# Patient Record
Sex: Female | Born: 1970
Health system: Southern US, Community
[De-identification: ages and names within clinical notes are randomized; demographics above are authoritative.]

## PROBLEM LIST (undated history)

## (undated) DIAGNOSIS — I10 Essential (primary) hypertension: Secondary | ICD-10-CM

## (undated) DIAGNOSIS — T7840XA Allergy, unspecified, initial encounter: Secondary | ICD-10-CM

## (undated) DIAGNOSIS — E785 Hyperlipidemia, unspecified: Secondary | ICD-10-CM

## (undated) DIAGNOSIS — K219 Gastro-esophageal reflux disease without esophagitis: Secondary | ICD-10-CM

## (undated) DIAGNOSIS — J45909 Unspecified asthma, uncomplicated: Secondary | ICD-10-CM

## (undated) HISTORY — DX: Hyperlipidemia, unspecified: E78.5

## (undated) HISTORY — PX: SMALL INTESTINE SURGERY: SHX150

## (undated) HISTORY — PX: ABDOMINAL HYSTERECTOMY: SHX81

## (undated) HISTORY — DX: Unspecified asthma, uncomplicated: J45.909

## (undated) HISTORY — DX: Essential (primary) hypertension: I10

## (undated) HISTORY — PX: HERNIA REPAIR: SHX51

## (undated) HISTORY — PX: TUBAL LIGATION: SHX77

## (undated) HISTORY — PX: UMBILICAL HERNIA REPAIR: SHX196

## (undated) HISTORY — DX: Allergy, unspecified, initial encounter: T78.40XA

## (undated) HISTORY — DX: Gastro-esophageal reflux disease without esophagitis: K21.9

---

## 2008-10-13 ENCOUNTER — Ambulatory Visit: Payer: Self-pay | Admitting: Interventional Radiology

## 2008-10-13 ENCOUNTER — Emergency Department (HOSPITAL_BASED_OUTPATIENT_CLINIC_OR_DEPARTMENT_OTHER): Admission: EM | Admit: 2008-10-13 | Discharge: 2008-10-13 | Payer: Self-pay | Admitting: Emergency Medicine

## 2008-10-26 ENCOUNTER — Ambulatory Visit: Payer: Self-pay | Admitting: Pulmonary Disease

## 2008-10-26 DIAGNOSIS — R05 Cough: Secondary | ICD-10-CM

## 2008-10-26 DIAGNOSIS — R51 Headache: Secondary | ICD-10-CM | POA: Insufficient documentation

## 2008-10-26 DIAGNOSIS — J309 Allergic rhinitis, unspecified: Secondary | ICD-10-CM | POA: Insufficient documentation

## 2008-10-26 DIAGNOSIS — R059 Cough, unspecified: Secondary | ICD-10-CM | POA: Insufficient documentation

## 2008-10-26 DIAGNOSIS — R519 Headache, unspecified: Secondary | ICD-10-CM | POA: Insufficient documentation

## 2010-09-02 ENCOUNTER — Emergency Department (HOSPITAL_BASED_OUTPATIENT_CLINIC_OR_DEPARTMENT_OTHER)
Admission: EM | Admit: 2010-09-02 | Discharge: 2010-09-02 | Payer: Self-pay | Source: Home / Self Care | Admitting: Emergency Medicine

## 2011-06-21 ENCOUNTER — Encounter: Payer: Self-pay | Admitting: *Deleted

## 2011-06-21 ENCOUNTER — Emergency Department (HOSPITAL_BASED_OUTPATIENT_CLINIC_OR_DEPARTMENT_OTHER)
Admission: EM | Admit: 2011-06-21 | Discharge: 2011-06-22 | Disposition: A | Discharge status: Home / Self Care | Payer: 59 | Attending: Emergency Medicine | Admitting: Emergency Medicine

## 2011-06-21 DIAGNOSIS — Z79899 Other long term (current) drug therapy: Secondary | ICD-10-CM | POA: Insufficient documentation

## 2011-06-21 DIAGNOSIS — R109 Unspecified abdominal pain: Secondary | ICD-10-CM | POA: Insufficient documentation

## 2011-06-21 NOTE — ED Notes (Signed)
After urinating she had a sudden onset of lower abd pain with radiation into her lower back.

## 2011-06-22 LAB — COMPREHENSIVE METABOLIC PANEL
ALT: 77 U/L — ABNORMAL HIGH (ref 0–35)
Albumin: 3.8 g/dL (ref 3.5–5.2)
BUN: 9 mg/dL (ref 6–23)
Calcium: 9.8 mg/dL (ref 8.4–10.5)
Creatinine, Ser: 0.8 mg/dL (ref 0.50–1.10)
GFR calc Af Amer: >90 mL/min (ref >90)
GFR calc non Af Amer: >90 mL/min (ref >90)
Total Bilirubin: 0.1 mg/dL — ABNORMAL LOW (ref 0.3–1.2)
Total Protein: 7.8 g/dL (ref 6.0–8.3)

## 2011-06-22 LAB — CBC
HCT: 38.7 % (ref 36.0–46.0)
Hemoglobin: 13.3 g/dL (ref 12.0–15.0)
Platelets: 299 10*3/uL (ref 150–400)
WBC: 10.3 10*3/uL (ref 4.0–10.5)

## 2011-06-22 LAB — DIFFERENTIAL
Basophils Relative: 1 % (ref 0–1)
Eosinophils Absolute: 0.3 10*3/uL (ref 0.0–0.7)
Eosinophils Relative: 3 % (ref 0–5)
Lymphocytes Relative: 25 % (ref 12–46)
Lymphs Abs: 2.5 10*3/uL (ref 0.7–4.0)
Monocytes Relative: 8 % (ref 3–12)
Neutro Abs: 6.6 10*3/uL (ref 1.7–7.7)
Neutrophils Relative %: 64 % (ref 43–77)

## 2011-06-22 LAB — URINALYSIS, ROUTINE W REFLEX MICROSCOPIC
Bilirubin Urine: NEGATIVE
Glucose, UA: NEGATIVE mg/dL
Ketones, ur: NEGATIVE mg/dL
Nitrite: NEGATIVE
Protein, ur: NEGATIVE mg/dL

## 2011-06-22 LAB — LIPASE, BLOOD: Lipase: 28 U/L (ref 11–59)

## 2011-06-22 LAB — PREGNANCY, URINE: Preg Test, Ur: NEGATIVE

## 2011-06-22 MED ORDER — MORPHINE SULFATE 4 MG/ML IJ SOLN
4.0000 mg | Freq: Once | INTRAMUSCULAR | Status: DC
  Filled 2011-06-22: qty 1

## 2011-06-22 MED ORDER — SODIUM CHLORIDE 0.9 % IV BOLUS (SEPSIS): 1000.0000 mL | Freq: Once | INTRAVENOUS | Status: DC

## 2011-06-22 MED ORDER — OXYCODONE-ACETAMINOPHEN 5-325 MG PO TABS: 2.0000 | ORAL_TABLET | Freq: Four times a day (QID) | ORAL | Status: AC | PRN

## 2011-06-22 MED ORDER — OXYCODONE-ACETAMINOPHEN 5-325 MG PO TABS
2.0000 | ORAL_TABLET | Freq: Once | ORAL | Status: AC
  Administered 2011-06-22: 2 via ORAL
  Filled 2011-06-22 (×2): qty 1

## 2011-06-22 NOTE — ED Provider Notes (Signed)
History     CSN: [id number] Arrival date & time: 06/21/2011 11:37 PM  Chief Complaint  Patient presents with  . Abdominal Pain    (Consider location/radiation/quality/duration/timing/severity/associated sxs/prior treatment) HPI Patient is a 40 year old female with a history of fibroids who presents with acute onset of lower abdominal pain this evening after urinating. This began about hour prior to presentation. Patient denied dysuria or hematuria. She instead reported having lower abdominal  pain across her suprapubic and right and left lower quadrants and radiating to her lower back. Patient denies any nausea or vomiting. She denied any diarrhea. There were no fevers. Patient denies any changes in her bowel habits. She denies any other associated or modifying factors. History reviewed. No pertinent past medical history.  History reviewed. No pertinent past surgical history.  No family history on file.  History  Substance Use Topics  . Smoking status: Never Smoker   . Smokeless tobacco: Not on file  . Alcohol Use: No    OB History    Grav Para Term Preterm Abortions TAB SAB Ect Mult Living                  Review of Systems  All other systems reviewed and are negative.    Allergies  Sulfonamide derivatives  Home Medications   Current Outpatient Rx  Name Route Sig Dispense Refill  . BIOTIN PO Oral Take 1 tablet by mouth daily.      Marland Kitchen VITAMIN B 12 PO Oral Take 1 tablet by mouth daily.      . CYCLOBENZAPRINE HCL 5 MG PO TABS Oral Take 5 mg by mouth daily as needed. For muscle spasms     . FERROUS SULFATE 325 (65 FE) MG PO TABS Oral Take 325 mg by mouth daily with breakfast.      . PRESCRIPTION MEDICATION Oral Take 1 tablet by mouth daily as needed. For pain and inflammation     . GAS RELIEF 80 PO Oral Take 160 mg by mouth once as needed. For gas     . OXYCODONE-ACETAMINOPHEN 5-325 MG PO TABS Oral Take 2 tablets by mouth every 6 (six) hours as needed for pain. 15 tablet  0    BP 129/84  Pulse 79  Temp(Src) 98 F (36.7 C) (Oral)  Resp 22  SpO2 96%  Physical Exam  Nursing note and vitals reviewed. Constitutional: She is oriented to person, place, and time. She appears well-developed and well-nourished. No distress.       Uncomfortable appearing  HENT:  Head: Normocephalic and atraumatic.  Eyes: Conjunctivae and EOM are normal. Pupils are equal, round, and reactive to light.  Neck: Normal range of motion.  Cardiovascular: Normal rate, regular rhythm and intact distal pulses.  Exam reveals friction rub.   No murmur heard. Abdominal: Soft. Bowel sounds are normal. She exhibits no distension. There is tenderness. There is no rebound and no guarding.       Patient has minimal lower abdominal tenderness no guarding, rebound, or rigidity.  Neurological: She is alert and oriented to person, place, and time. No cranial nerve deficit. She exhibits normal muscle tone. Coordination normal.  Skin: Skin is warm and dry. No rash noted.  Psychiatric: She has a normal mood and affect.    ED Course  Procedures (including critical care time)  Labs Reviewed  COMPREHENSIVE METABOLIC PANEL - Abnormal; Notable for the following:    AST 57 (*)    ALT 77 (*)    Total Bilirubin 0.1 (*)  All other components within normal limits  URINALYSIS, ROUTINE W REFLEX MICROSCOPIC  PREGNANCY, URINE  CBC  DIFFERENTIAL  LIPASE, BLOOD   No results found.   1. Abdominal pain       MDM  Patient was examined by myself was treated with oral pain medication. She had laboratory workup was unremarkable. This included unremarkable urinalysis as well as negative urine pregnancy test unremarkable CBC as well as normal lipase and renal panel. Patient did have slight increase in liver panel on her hepatic function patient which was feeling better prior to discharge. She rated her pain as a 3/10 down from a 10 out of 10. Patient was comfortable plan for discharge home with pain and  nausea medications. She was written prescriptions for these and was discharged home in the company of her husband in good condition.        Cyndra Numbers, MD 06/22/11 [phone]
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
## 2015-03-27 ENCOUNTER — Emergency Department (HOSPITAL_BASED_OUTPATIENT_CLINIC_OR_DEPARTMENT_OTHER)
Admission: EM | Admit: 2015-03-27 | Discharge: 2015-03-27 | Disposition: A | Discharge status: Home / Self Care | Payer: BLUE CROSS/BLUE SHIELD | Attending: Emergency Medicine | Admitting: Emergency Medicine

## 2015-03-27 ENCOUNTER — Encounter (HOSPITAL_BASED_OUTPATIENT_CLINIC_OR_DEPARTMENT_OTHER): Payer: Self-pay | Admitting: *Deleted

## 2015-03-27 DIAGNOSIS — Z79899 Other long term (current) drug therapy: Secondary | ICD-10-CM | POA: Insufficient documentation

## 2015-03-27 DIAGNOSIS — H16001 Unspecified corneal ulcer, right eye: Secondary | ICD-10-CM | POA: Insufficient documentation

## 2015-03-27 DIAGNOSIS — Z973 Presence of spectacles and contact lenses: Secondary | ICD-10-CM | POA: Insufficient documentation

## 2015-03-27 MED ORDER — MOXIFLOXACIN HCL 0.5 % OP SOLN: 1.0000 [drp] | Freq: Three times a day (TID) | OPHTHALMIC | Status: DC

## 2015-03-27 MED ORDER — TETRACAINE HCL 0.5 % OP SOLN
OPHTHALMIC | Status: AC
  Administered 2015-03-27: 04:00:00
  Filled 2015-03-27: qty 2

## 2015-03-27 MED ORDER — FLUORESCEIN SODIUM 1 MG OP STRP
ORAL_STRIP | OPHTHALMIC | Status: AC
  Administered 2015-03-27: 04:00:00
  Filled 2015-03-27: qty 1

## 2015-03-27 NOTE — ED Notes (Signed)
MD with pt  

## 2015-03-27 NOTE — ED Provider Notes (Signed)
CSN: [id number]     Arrival date & time 03/27/15  0258 History   First MD Initiated Contact with Patient 03/27/15 0319     Chief Complaint  Patient presents with  . eye irritation      (Consider location/radiation/quality/duration/timing/severity/associated sxs/prior Treatment) HPI Comments: Patient is a 44 year old female with no significant past medical history. She presents for evaluation of right eye discomfort that started approximately 24 hours ago. This began in the absence of any injury or trauma, however the patient is a contact lens wear. She stated 2 evenings ago her eye began to feel irritated so she took her contacts out and has not worn them since. It is now becoming worse and she reports some blurry vision. She denies any discharge or drainage.  Patient is a 44 y.o. female presenting with eye pain. The history is provided by the patient.  Eye Pain This is a new problem. The current episode started yesterday. The problem occurs constantly. The problem has been rapidly worsening. Nothing aggravates the symptoms. Nothing relieves the symptoms. She has tried nothing for the symptoms. The treatment provided no relief.    History reviewed. No pertinent past medical history. Past Surgical History  Procedure Laterality Date  . Tubal ligation    . Abdominal hysterectomy    . Umbilical hernia repair     No family history on file. History  Substance Use Topics  . Smoking status: Never Smoker   . Smokeless tobacco: Not on file  . Alcohol Use: No   OB History    No data available     Review of Systems  Eyes: Positive for pain.  All other systems reviewed and are negative.     Allergies  Sulfonamide derivatives  Home Medications   Prior to Admission medications   Medication Sig Start Date End Date Taking? Authorizing Provider  BIOTIN PO Take 1 tablet by mouth daily.      Historical Provider, MD  Cyanocobalamin (VITAMIN B 12 PO) Take 1 tablet by mouth daily.       Historical Provider, MD  cyclobenzaprine (FLEXERIL) 5 MG tablet Take 5 mg by mouth daily as needed. For muscle spasms     Historical Provider, MD  ferrous sulfate 325 (65 FE) MG tablet Take 325 mg by mouth daily with breakfast.      Historical Provider, MD  PRESCRIPTION MEDICATION Take 1 tablet by mouth daily as needed. For pain and inflammation     Historical Provider, MD  Simethicone (GAS RELIEF 80 PO) Take 160 mg by mouth once as needed. For gas     Historical Provider, MD   BP 163/93 mmHg  Pulse 88  Temp(Src) 98.4 F (36.9 C) (Oral)  Resp 20  Ht  (1.575 m)  Wt 189 lb (85.73 kg)  BMI 34.56 kg/m2  SpO2 98% Physical Exam  Constitutional: She appears well-developed and well-nourished. No distress.  HENT:  Head: Normocephalic and atraumatic.  Eyes: EOM are normal. Pupils are equal, round, and reactive to light.  The right conjunctiva is injected. There is what appears to be a small ulcer on the right cornea at approximately 6:00.  Neck: Normal range of motion. Neck supple.  Skin: Skin is warm and dry. She is not diaphoretic.  Nursing note and vitals reviewed.   ED Course  Procedures (including critical care time) Labs Review Labs Reviewed - No data to display  Imaging Review No results found.   EKG Interpretation None    Eye Pressure by  Tonopen:  14, 15  MDM   Final diagnoses:  None    Corneal ulcer.  Per Dr. Charlotte SanesMcCuen, vigamox.  She will see in the office today at 12 noon.    Geoffery Lyonsouglas Damion Kant, MD 03/27/15 [phone]

## 2015-03-27 NOTE — ED Notes (Signed)
Patient states eye irritation, cause unknown.

## 2015-03-27 NOTE — ED Notes (Addendum)
C/o right eye swelling that started on Saturday. Denies any injury. States she felt like she had an eyelash in it. States throughout the day her right eye became irritated and sensitivity to light. States she does wear contacts. Redness noted to sclera on exam. Able to see but states a little blurry in right eye only.

## 2015-03-27 NOTE — Discharge Instructions (Signed)
Refrain from contact lens use until okay by ophthalmology.  Vigamox eyedrops: Place 1 drop into the right eye every 2 hours until followed by ophthalmology.  Dr. Charlotte SanesMcCuen will see her in her office today at 12 noon for a recheck. Her office location and contact information has been provided in this discharge summary.   Corneal Ulcer A corneal ulcer is an open sore on the cornea. The cornea is the clear covering at the front and center of the eye.  CAUSES  Most corneal ulcers are caused by infection, but there are other causes as well.  Bacterial infection. A bacterial infection can occur and cause a corneal ulcer if:  Contact lenses are worn too long (especially overnight) or are not properly cared for.  An eye injury occurs, allowing bacteria to infect the area of injury.  Viral infection. A viral infection can occur and cause a corneal ulcer if:  The eye becomes infected with a virus, such as the herpes simplex (cold sore) virus, chickenpox virus, or shingles virus.  Fungal infection. A fungal infection can occur and cause a corneal ulcer if:  An eye injury resulted from contact with a plant or plant material.  An anti-inflammatory eye drop is overused.  You have a weakened immune system.  Contact lenses are improperly cared for or become infected.  Foreign bodies in the eye, such as sand, glass, or small pieces of glass or metal.  Dry eyes.  Certain disorders that prevent eyelids from closing completely, such as Bell's palsy.  Contact lenses, especially extended-wear soft contact lenses. Contact lenses can:  Scratch the cornea's surface, allowing bacteria to enter the scratch.  Trap dirt underneath the contact lens, which can scratch the cornea.  Harbor bacteria and fungi, making it more likely for bacterial infections to occur.  Block oxygen from the cornea, making it more likely for infections to occur. SYMPTOMS   Eye pain that is often severe.  Blurry  vision.  Light sensitivity.  Pus or thick discharge coming from your eye.  Eye redness.  Feeling like something is in your eye.  Watery or itchy eye.  Burning or stinging feeling. Some ulcers that are very big may be seen as a white spot on the cornea. DIAGNOSIS  An eye exam will be performed. Your health care provider may use a special kind of microscope (slit lamp) to look at the cornea. Eye drops may be put into the eye to make the ulcer easier to see. If it is suspected that an infection caused the corneal ulcer, tissue samples or cultures from the eye may be taken. Numbing eye drops will be given before any samples or cultures are taken. The samples or cultures will be examined in the lab to check for bacteria, viruses, or fungi. TREATMENT  Treatment of the corneal ulcer depends on the cause. If your ulcer is severe, you may be given antibiotic eye drops up until your health care provider knows the test results. Other treatments can include:  Antibacterial, antiviral, or antifungal eye drops or ointment.  Removing the foreign body that caused the eye injury.  Artificial tears or a bandage contact lens if severe dry eyes caused the corneal ulcer.  Over-the-counter or prescription pain medicine.  Steroidal eye drops if the eye is inflamed and swollen.  Antibiotic medicines by mouth.  An injection of medicine under the thin membrane covering the eyeball (conjunctiva). This allows medicine to reach the ulcer in high doses.  Eye patching to reduce irritation  from blinking and bright light. An eye patch may not be given if the ulcer was caused by a bacterial infection. If the corneal ulcer causes a scar on the cornea that interferes with vision, hospitalization and surgery may be needed to replace the cornea (corneal transplant). HOME CARE INSTRUCTIONS   If prescribed, use your antibiotic pills, eye drops, or ointment as directed. Continue using them even if you start to feel  better. You may have to apply eye drops as often as every few minutes to every hour, for days. It may be necessary to set your alarm clock every few minutes to every hour during the night. This is absolutely necessary.  Only take over-the-counter or prescription medicines as directed by your health care provider.  Apply artificial tears as needed if you have dry eyes.  Do not touch or rub your eye, because this may increase the irritation and spread the infection.  Avoid wearing eye makeup.  Stay in a dark room and use sunglasses if you have light sensitivity.  Apply cool packs to your eye to relieve discomfort and swelling.  If your eye is patched, you should not drive or use machinery. You will have reduced side vision and ability to judge distance.  Do not drive or operate machinery until approved by your health care provider. Your ability to judge distances is impaired.  Follow up with your health care provider as directed.  Do not wear contact lenses until your health care provider approves. If you normally wear contact lenses, follow these general rules to avoid the risk of a corneal ulcer:  Do not wear contact lenses while you sleep.  Wash your hands before removing contact lenses.  Properly sterilize and store your contact lenses.  Regularly clean your contact lens case.  Do not use your saliva or tap water to clean or wet your contact lenses.  Remove your contact lenses if your eye becomes irritated. You may put them back in once your eyes feel better. SEEK IMMEDIATE MEDICAL CARE IF:   You notice a change in your vision.  Your pain is getting worse, not better.  You have increasing discharge from the eye. MAKE SURE YOU:   Understand these instructions.  Will watch your condition.  Will get help right away if you are not doing well or get worse. Document Released: 10/04/2004 Document Revised: 04/29/2013 Document Reviewed: 01/27/2013 [facility] Patient  Information 2015 [place], [place]. This information is not intended to replace advice given to you by your health care provider. Make sure you discuss any questions you have with your health care provider.

## 2015-09-11 HISTORY — PX: OTHER SURGICAL HISTORY: SHX169

## 2015-09-21 DIAGNOSIS — K432 Incisional hernia without obstruction or gangrene: Secondary | ICD-10-CM | POA: Insufficient documentation

## 2015-10-21 DIAGNOSIS — K56609 Unspecified intestinal obstruction, unspecified as to partial versus complete obstruction: Secondary | ICD-10-CM | POA: Insufficient documentation

## 2015-11-01 DIAGNOSIS — R0600 Dyspnea, unspecified: Secondary | ICD-10-CM | POA: Insufficient documentation

## 2015-11-22 DIAGNOSIS — Z8719 Personal history of other diseases of the digestive system: Secondary | ICD-10-CM | POA: Insufficient documentation

## 2016-02-02 DIAGNOSIS — E785 Hyperlipidemia, unspecified: Secondary | ICD-10-CM | POA: Insufficient documentation

## 2017-07-09 ENCOUNTER — Encounter: Payer: Self-pay | Admitting: Internal Medicine

## 2017-07-09 ENCOUNTER — Other Ambulatory Visit (INDEPENDENT_AMBULATORY_CARE_PROVIDER_SITE_OTHER): Payer: BLUE CROSS/BLUE SHIELD

## 2017-07-09 ENCOUNTER — Ambulatory Visit (INDEPENDENT_AMBULATORY_CARE_PROVIDER_SITE_OTHER): Payer: BLUE CROSS/BLUE SHIELD | Admitting: Internal Medicine

## 2017-07-09 VITALS — BP 160/100 | HR 90 | Temp 98.3°F | Resp 16 | Wt 214.5 lb

## 2017-07-09 DIAGNOSIS — E785 Hyperlipidemia, unspecified: Secondary | ICD-10-CM | POA: Diagnosis not present

## 2017-07-09 DIAGNOSIS — J069 Acute upper respiratory infection, unspecified: Secondary | ICD-10-CM | POA: Insufficient documentation

## 2017-07-09 DIAGNOSIS — I10 Essential (primary) hypertension: Secondary | ICD-10-CM

## 2017-07-09 DIAGNOSIS — K219 Gastro-esophageal reflux disease without esophagitis: Secondary | ICD-10-CM | POA: Insufficient documentation

## 2017-07-09 DIAGNOSIS — R945 Abnormal results of liver function studies: Secondary | ICD-10-CM

## 2017-07-09 DIAGNOSIS — R7989 Other specified abnormal findings of blood chemistry: Secondary | ICD-10-CM

## 2017-07-09 DIAGNOSIS — J45909 Unspecified asthma, uncomplicated: Secondary | ICD-10-CM | POA: Diagnosis not present

## 2017-07-09 LAB — URINALYSIS, ROUTINE W REFLEX MICROSCOPIC
Bilirubin Urine: NEGATIVE
Hgb urine dipstick: NEGATIVE
KETONES UR: NEGATIVE
Leukocytes, UA: NEGATIVE
Nitrite: NEGATIVE
PH: 7 (ref 5.0–8.0)
RBC / HPF: NONE SEEN (ref 0–?)
SPECIFIC GRAVITY, URINE: 1.02 (ref 1.000–1.030)
Total Protein, Urine: NEGATIVE
Urine Glucose: NEGATIVE
Urobilinogen, UA: 0.2 (ref 0.0–1.0)

## 2017-07-09 LAB — BASIC METABOLIC PANEL
BUN: 10 mg/dL (ref 6–23)
CALCIUM: 9.8 mg/dL (ref 8.4–10.5)
CHLORIDE: 100 meq/L (ref 96–112)
CO2: 29 mEq/L (ref 19–32)
CREATININE: 0.8 mg/dL (ref 0.40–1.20)
GFR: 98.8 mL/min (ref >60.00)
Glucose, Bld: 86 mg/dL (ref 70–99)
Potassium: 3.9 mEq/L (ref 3.5–5.1)
Sodium: 141 mEq/L (ref 135–145)

## 2017-07-09 LAB — POCT INFLUENZA A/B
INFLUENZA A, POC: NEGATIVE
Influenza B, POC: NEGATIVE

## 2017-07-09 LAB — LIPID PANEL
Cholesterol: 226 mg/dL — ABNORMAL HIGH (ref 0–200)
HDL: 68.2 mg/dL (ref >39.00)
LDL Cholesterol: 132 mg/dL — ABNORMAL HIGH (ref 0–99)
NonHDL: 158
Total CHOL/HDL Ratio: 3
Triglycerides: 128 mg/dL (ref 0.0–149.0)
VLDL: 25.6 mg/dL (ref 0.0–40.0)

## 2017-07-09 LAB — TSH: TSH: 2.98 u[IU]/mL (ref 0.35–4.50)

## 2017-07-09 MED ORDER — AMOXICILLIN 500 MG PO CAPS: 1000.0000 mg | ORAL_CAPSULE | Freq: Two times a day (BID) | ORAL | 0 refills | Status: DC

## 2017-07-09 MED ORDER — ALBUTEROL SULFATE HFA 108 (90 BASE) MCG/ACT IN AERS: 2.0000 | INHALATION_SPRAY | Freq: Four times a day (QID) | RESPIRATORY_TRACT | 12 refills | Status: DC | PRN

## 2017-07-09 MED ORDER — AMLODIPINE BESYLATE 10 MG PO TABS: 10.0000 mg | ORAL_TABLET | Freq: Every day | ORAL | 3 refills | Status: DC

## 2017-07-09 NOTE — Patient Instructions (Addendum)
Your flu test was negative  Please take all new medication as prescribed - the antibiotic  You can also take Delsym OTC for cough, and/or Mucinex (or it's generic off brand) for congestion, and tylenol as needed for pain.  Ok to stop the verapamil  Please take all new medication as prescribed - the amlodipine for blood pressure  Please remember to check your BP at [facility] regularly with the goal is to be less than 140/90  Please continue all other medications as before, and refills have been done if requested.  Please have the pharmacy call with any other refills you may need.  You will be contacted regarding the referral for: Nutrition  Please continue your efforts at being more active, low cholesterol diet, and weight control   Please keep your appointments with your specialists as you may have planned  Please go to the LAB in the Basement (turn left off the elevator) for the tests to be done today  You will be contacted by phone if any changes need to be made immediately.  Otherwise, you will receive a letter about your results with an explanation, but please check with MyChart first.  Please remember to sign up for MyChart if you have not done so, as this will be important to you in the future with finding out test results, communicating by private email, and scheduling acute appointments online when needed.  Please return in 6 months, or sooner if needed

## 2017-07-09 NOTE — Assessment & Plan Note (Signed)
Uncontrolled, to change verapamil to amlodipine 10 qd,  to f/u any worsening symptoms or concerns, f/u BP at home as well, goal < 140/90

## 2017-07-09 NOTE — Assessment & Plan Note (Signed)
stable overall by history and exam, and pt to continue medical treatment as before,  to f/u any worsening symptoms or concerns 

## 2017-07-09 NOTE — Progress Notes (Signed)
Subjective:    Patient ID: Ellen Fernandez, female    DOB: 11-24-1970, 46 y.o.   MRN: [id number]  HPI  Here to establish, just moved to [place], [place] [facility] [place].  Pt denies chest pain, increasing sob or doe, orthopnea, PND, increased LE swelling, palpitations, dizziness or syncope.  Pt denies new neurological symptoms such as new headache, or facial or extremity weakness or numbness.  Pt denies polydipsia, polyuria, or low sugar episode.  Pt states overall good compliance with meds, mostly trying to follow appropriate diet, with wt overall stable,  but little exercise however. Recent ED visit for chest pain per pt likely related to bronchitis and cough, now resolved.  Had pna recently and concerned about this but no fever, chills, sob.  Also with 4-5 days nasal congestion, fatigue, hot and cold, body aches, loss of appetite.  No worsening cough CP, but can tend to increased wheeze with exertion, has been using the inhlaer more often Past Medical History:  Diagnosis Date  . Asthma   . GERD (gastroesophageal reflux disease)   . HTN (hypertension)   . Hyperlipidemia    Past Surgical History:  Procedure Laterality Date  . ABDOMINAL HYSTERECTOMY    . partial small bowel resection  2017   complication of umbilical hernia repair  . TUBAL LIGATION    . UMBILICAL HERNIA REPAIR      reports that she has never smoked. She does not have any smokeless tobacco history on file. She reports that she drinks alcohol. She reports that she does not use drugs. family history includes Cancer in her father; Hypertension in her mother; Rashes / Skin problems in her daughter; Rheum arthritis in her daughter. Allergies  Allergen Reactions  . Sulfonamide Derivatives Hives   No current outpatient prescriptions on file prior to visit.   No current facility-administered medications on file prior to visit.    Review of Systems  Constitutional: Negative for other unusual diaphoresis or sweats HENT:  Negative for ear discharge or swelling Eyes: Negative for other worsening visual disturbances Respiratory: Negative for stridor or other swelling  Gastrointestinal: Negative for worsening distension or other blood Genitourinary: Negative for retention or other urinary change Musculoskeletal: Negative for other MSK pain or swelling Skin: Negative for color change or other new lesions Neurological: Negative for worsening tremors and other numbness  Psychiatric/Behavioral: Negative for worsening agitation or other fatigue All other system neg per pt    Objective:   Physical Exam BP (!) 160/100 (BP Location: Left Arm, Patient Position: Sitting, Cuff Size: Large)   Pulse 90   Temp 98.3 F (36.8 C) (Oral)   Resp 16   Wt 214 lb 8 oz (97.3 kg)   SpO2 96%   BMI 39.23 kg/m  VS noted, mild ill Constitutional: Pt appears in NAD HENT: Head: NCAT.  Right Ear: External ear normal.  Left Ear: External ear normal.  Eyes: . Pupils are equal, round, and reactive to light. Conjunctivae and EOM are normal Nose: without d/c or deformity Neck: Neck supple. Gross normal ROM Bilat tm's with mild erythema.  Max sinus areas mild tender.  Pharynx with mild erythema, no exudate Cardiovascular: Normal rate and regular rhythm.   Pulmonary/Chest: Effort normal and breath sounds without rales or wheezing.  Neurological: Pt is alert. At baseline orientation, motor grossly intact Skin: Skin is warm. No rashes, other new lesions, no LE edema Psychiatric: Pt behavior is normal without agitation  No other exam findings  Lab  Results  Component Value Date   WBC 10.3 06/22/2011   HGB 13.3 06/22/2011   HCT 38.7 06/22/2011   PLT 299 06/22/2011   GLUCOSE 95 06/22/2011   ALT 77 (H) 06/22/2011   AST 57 (H) 06/22/2011   NA 136 06/22/2011   K 3.6 06/22/2011   CL 103 06/22/2011   CREATININE 0.80 06/22/2011   BUN 9 06/22/2011   CO2 23 06/22/2011   POCT Influenza A/B  Order: [id number]  Status:  Final result  Visible to patient:  No (Not Released) Dx:  Upper respiratory tract infection, un...   Ref Range & Units 11:36  Influenza A, POC Negative Negative   Influenza B, POC Negative Negative            Assessment & Plan:

## 2017-07-09 NOTE — Assessment & Plan Note (Signed)
For lower chol diet, goal ldl < 100, for f/u lab today but pt is not wanting statin changes

## 2017-07-09 NOTE — Assessment & Plan Note (Signed)
Mild to mod, for antibx course,  to f/u any worsening symptoms or concerns 

## 2017-07-10 ENCOUNTER — Encounter: Payer: Self-pay | Admitting: Internal Medicine

## 2017-07-10 LAB — HEPATITIS PANEL, ACUTE
HEP A IGM: NONREACTIVE
HEP C AB: NONREACTIVE
Hep B C IgM: NONREACTIVE
Hepatitis B Surface Ag: NONREACTIVE
SIGNAL TO CUT-OFF: 0.02 (ref <1.00)

## 2017-09-16 ENCOUNTER — Ambulatory Visit: Payer: BLUE CROSS/BLUE SHIELD | Admitting: Internal Medicine

## 2017-09-16 ENCOUNTER — Encounter: Payer: Self-pay | Admitting: Internal Medicine

## 2017-09-16 VITALS — BP 124/86 | HR 115 | Temp 98.5°F | Ht 62.0 in | Wt 211.0 lb

## 2017-09-16 DIAGNOSIS — J069 Acute upper respiratory infection, unspecified: Secondary | ICD-10-CM | POA: Diagnosis not present

## 2017-09-16 DIAGNOSIS — E785 Hyperlipidemia, unspecified: Secondary | ICD-10-CM | POA: Diagnosis not present

## 2017-09-16 DIAGNOSIS — I10 Essential (primary) hypertension: Secondary | ICD-10-CM

## 2017-09-16 DIAGNOSIS — R6889 Other general symptoms and signs: Secondary | ICD-10-CM

## 2017-09-16 DIAGNOSIS — Z0001 Encounter for general adult medical examination with abnormal findings: Secondary | ICD-10-CM | POA: Diagnosis not present

## 2017-09-16 LAB — POCT INFLUENZA A/B
Influenza A, POC: NEGATIVE
Influenza B, POC: NEGATIVE

## 2017-09-16 MED ORDER — HYDROCODONE-HOMATROPINE 5-1.5 MG/5ML PO SYRP: 5.0000 mL | ORAL_SOLUTION | Freq: Four times a day (QID) | ORAL | 0 refills | Status: AC | PRN

## 2017-09-16 MED ORDER — AZITHROMYCIN 250 MG PO TABS: ORAL_TABLET | ORAL | 1 refills | Status: DC

## 2017-09-16 NOTE — Progress Notes (Signed)
Subjective:    Patient ID: Ellen Fernandez, female    DOB: 05-Jul-1971, 47 y.o.   MRN: [id number]  HPI  Here for wellness and f/u;  Overall doing ok;  Pt denies Chest pain, worsening SOB, DOE, wheezing, orthopnea, PND, worsening LE edema, palpitations, dizziness or syncope.  Pt denies neurological change such as new headache, facial or extremity weakness.  Pt denies polydipsia, polyuria, or low sugar symptoms. Pt states overall good compliance with treatment and medications, good tolerability, and has been trying to follow appropriate diet.  Pt denies worsening depressive symptoms, suicidal ideation or panic. No fever, night sweats, wt loss, loss of appetite, or other constitutional symptoms.  Pt states good ability with ADL's, has low fall risk, home safety reviewed and adequate, no other significant changes in hearing or vision, and only occasionally active with exercise. Is s/p TAH, last pelvic per GYN about 2 yrs ago.   Incidentally also here with 2-3 days acute onset fever, facial pain, pressure, headache, general weakness and malaise, and greenish d/c, with mild ST and cough.  No other interval hx or complaints  Past Medical History:  Diagnosis Date  . Asthma   . GERD (gastroesophageal reflux disease)   . HTN (hypertension)   . Hyperlipidemia    Past Surgical History:  Procedure Laterality Date  . ABDOMINAL HYSTERECTOMY    . partial small bowel resection  2017   complication of umbilical hernia repair  . TUBAL LIGATION    . UMBILICAL HERNIA REPAIR      reports that  has never smoked. she has never used smokeless tobacco. She reports that she drinks alcohol. She reports that she does not use drugs. family history includes Cancer in her father; Hypertension in her mother; Rashes / Skin problems in her daughter; Rheum arthritis in her daughter. Allergies  Allergen Reactions  . Sulfonamide Derivatives Hives   Current Outpatient Medications on File Prior to Visit  Medication Sig Dispense  Refill  . albuterol (PROVENTIL HFA;VENTOLIN HFA) 108 (90 Base) MCG/ACT inhaler Inhale 2 puffs into the lungs every 6 (six) hours as needed for wheezing or shortness of breath. 1 Inhaler 12  . amLODipine (NORVASC) 10 MG tablet Take 1 tablet (10 mg total) by mouth daily. 90 tablet 3  . hyoscyamine (LEVSIN) 0.125 MG/5ML ELIX Take 0.125 mg by mouth every 4 (four) hours as needed.    Marland Kitchen. ibuprofen (ADVIL,MOTRIN) 800 MG tablet     . meloxicam (MOBIC) 15 MG tablet Take 15 mg by mouth daily.    Marland Kitchen. omeprazole (PRILOSEC) 40 MG capsule Take 40 mg by mouth daily.    . ondansetron (ZOFRAN) 8 MG tablet Take by mouth every 8 (eight) hours as needed for nausea or vomiting.     No current facility-administered medications on file prior to visit.     Review of Systems Constitutional: Negative for other unusual diaphoresis, sweats, appetite or weight changes HENT: Negative for other worsening hearing loss, ear pain, facial swelling, mouth sores or neck stiffness.   Eyes: Negative for other worsening pain, redness or other visual disturbance.  Respiratory: Negative for other stridor or swelling Cardiovascular: Negative for other palpitations or other chest pain  Gastrointestinal: Negative for worsening diarrhea or loose stools, blood in stool, distention or other pain Genitourinary: Negative for hematuria, flank pain or other change in urine volume.  Musculoskeletal: Negative for myalgias or other joint swelling.  Skin: Negative for other color change, or other wound or worsening drainage.  Neurological: Negative for other  syncope or numbness. Hematological: Negative for other adenopathy or swelling Psychiatric/Behavioral: Negative for hallucinations, other worsening agitation, SI, self-injury, or new decreased concentration All other system neg per pt    Objective:   Physical Exam BP 124/86   Pulse (!) 115   Temp 98.5 F (36.9 C) (Oral)   Ht 5\' 2"  (1.575 m)   Wt 211 lb (95.7 kg)   SpO2 98%   BMI 38.59  kg/m  VS noted, mild ill Constitutional: Pt is oriented to person, place, and time. Appears well-developed and well-nourished, in no significant distress and comfortable Head: Normocephalic and atraumatic  Eyes: Conjunctivae and EOM are normal. Pupils are equal, round, and reactive to light Bilat tm's with mild erythema.  Max sinus areas mild tender.  Pharynx with mild erythema, no exudate Right Ear: External ear normal without discharge Left Ear: External ear normal without discharge Nose: Nose without discharge or deformity Mouth/Throat: Oropharynx is without other ulcerations and moist  Neck: Normal range of motion. Neck supple. No JVD present. No tracheal deviation present or significant neck LA or mass Cardiovascular: Normal rate, regular rhythm, normal heart sounds and intact distal pulses.   Pulmonary/Chest: WOB normal and breath sounds without rales or wheezing  Abdominal: Soft. Bowel sounds are normal. NT. No HSM  Musculoskeletal: Normal range of motion. Exhibits no edema Lymphadenopathy: Has no other cervical adenopathy.  Neurological: Pt is alert and oriented to person, place, and time. Pt has normal reflexes. No cranial nerve deficit. Motor grossly intact, Gait intact Skin: Skin is warm and dry. No rash noted or new ulcerations Psychiatric:  Has normal mood and affect. Behavior is normal without agitation No other exam findings  Lab Results  Component Value Date   WBC 10.3 06/22/2011   HGB 13.3 06/22/2011   HCT 38.7 06/22/2011   PLT 299 06/22/2011   GLUCOSE 86 07/09/2017   CHOL 226 (H) 07/09/2017   TRIG 128.0 07/09/2017   HDL 68.20 07/09/2017   LDLCALC 132 (H) 07/09/2017   ALT 77 (H) 06/22/2011   AST 57 (H) 06/22/2011   NA 141 07/09/2017   K 3.9 07/09/2017   CL 100 07/09/2017   CREATININE 0.80 07/09/2017   BUN 10 07/09/2017   CO2 29 07/09/2017   TSH 2.98 07/09/2017  POCT Influenza A/B  Order: [id number]  Status:  Final result Visible to patient:  No (Not  Released) Dx:  Flu-like symptoms   Ref Range & Units 13:22 77mo ago  Influenza A, POC Negative Negative  Negative   Influenza B, POC Negative Negative  Negative            Assessment & Plan:

## 2017-09-16 NOTE — Patient Instructions (Signed)
Please take all new medication as prescribed  - the antibiotic and cough medicine  Please continue all other medications as before, and refills have been done if requested.  Please have the pharmacy call with any other refills you may need.  Please continue your efforts at being more active, low cholesterol diet, and weight control.  You are otherwise up to date with prevention measures today.  Please keep your appointments with your specialists as you may have planned  OK to cancel your appt for April 2019  Please return in 1 year for your yearly visit, or sooner if needed, with Lab testing done 3-5 days before

## 2017-09-17 ENCOUNTER — Encounter: Payer: Self-pay | Admitting: Internal Medicine

## 2017-09-17 NOTE — Assessment & Plan Note (Signed)
Mild to mod, for antibx course,  to f/u any worsening symptoms or concerns  In addition to the time spent performing CPE, I spent an additional 15 minutes face to face,in which greater than 50% of this time was spent in counseling and coordination of care for patient's illness as documented, including the differential dx, treatment, further evaluation and other management of Acute upper resp infection, HTN and HLD

## 2017-09-17 NOTE — Assessment & Plan Note (Signed)

## 2017-09-17 NOTE — Assessment & Plan Note (Signed)
Mild elevaated, for lower chol diet o/w stable, recent data reviewed with pt, and pt to continue medical treatment as before,  to f/u any worsening symptoms or concerns Lab Results  Component Value Date   LDLCALC 132 (H) 07/09/2017  for f/u labs, consider statin for LDL > 100

## 2017-09-17 NOTE — Assessment & Plan Note (Signed)
stable overall by history and exam, recent data reviewed with pt, and pt to continue medical treatment as before,  to f/u any worsening symptoms or concerns BP Readings from Last 3 Encounters:  09/16/17 124/86  07/09/17 (!) 160/100  03/27/15 159/98

## 2018-01-07 ENCOUNTER — Encounter: Payer: Self-pay | Admitting: Internal Medicine

## 2018-01-07 ENCOUNTER — Ambulatory Visit: Payer: BLUE CROSS/BLUE SHIELD | Admitting: Internal Medicine

## 2018-01-07 VITALS — BP 122/82 | HR 87 | Temp 98.5°F | Ht 62.0 in | Wt 211.0 lb

## 2018-01-07 DIAGNOSIS — J45909 Unspecified asthma, uncomplicated: Secondary | ICD-10-CM | POA: Diagnosis not present

## 2018-01-07 DIAGNOSIS — E785 Hyperlipidemia, unspecified: Secondary | ICD-10-CM | POA: Diagnosis not present

## 2018-01-07 DIAGNOSIS — J309 Allergic rhinitis, unspecified: Secondary | ICD-10-CM | POA: Diagnosis not present

## 2018-01-07 DIAGNOSIS — I10 Essential (primary) hypertension: Secondary | ICD-10-CM

## 2018-01-07 DIAGNOSIS — L509 Urticaria, unspecified: Secondary | ICD-10-CM | POA: Diagnosis not present

## 2018-01-07 DIAGNOSIS — Z0000 Encounter for general adult medical examination without abnormal findings: Secondary | ICD-10-CM

## 2018-01-07 MED ORDER — HYDROCHLOROTHIAZIDE 12.5 MG PO CAPS: 12.5000 mg | ORAL_CAPSULE | Freq: Every day | ORAL | 3 refills | Status: DC

## 2018-01-07 MED ORDER — TRIAMCINOLONE ACETONIDE 55 MCG/ACT NA AERO: 2.0000 | INHALATION_SPRAY | Freq: Every day | NASAL | 12 refills | Status: DC

## 2018-01-07 MED ORDER — PREDNISONE 10 MG PO TABS: ORAL_TABLET | ORAL | 0 refills | Status: DC

## 2018-01-07 MED ORDER — ROSUVASTATIN CALCIUM 10 MG PO TABS: 10.0000 mg | ORAL_TABLET | Freq: Every day | ORAL | 3 refills | Status: DC

## 2018-01-07 MED ORDER — METHYLPREDNISOLONE ACETATE 80 MG/ML IJ SUSP
80.0000 mg | Freq: Once | INTRAMUSCULAR | Status: AC
  Administered 2018-01-07: 80 mg via INTRAMUSCULAR

## 2018-01-07 NOTE — Assessment & Plan Note (Addendum)
Mild to mod, for depomedrol IM 80,  to f/u any worsening symptoms or concerns  Note:  Total time for pt hx, exam, review of record with pt in the room, determination of diagnoses and plan for further eval and tx is > 40 min, with over 50% spent in coordination and counseling of patient including the differential dx, tx, further evaluation and other management of hives, allergies, asthma, HTN with leg swelling and uncontrolled HLD

## 2018-01-07 NOTE — Progress Notes (Signed)
Subjective:    Patient ID: Ellen Fernandez, female    DOB: Sep 17, 1970, 47 y.o.   MRN: [id number]  HPI  Here to f/u, Does have several wks ongoing nasal allergy symptoms with clearish congestion, itch and sneezing, without fever, pain, ST, cough, swelling or wheezing, despite allegra D, benadryl and chlorphenerime.  Rash with hives to arms occasionally as well, better with benadryl this am but keeps coming back.  Pt denies chest pain, increased sob or doe, wheezing, orthopnea, PND, palpitations, dizziness or syncope, but has had some mild worsening bilat LE edema.  Pt denies polydipsia, polyuria   Past Medical History:  Diagnosis Date  . Asthma   . GERD (gastroesophageal reflux disease)   . HTN (hypertension)   . Hyperlipidemia    Past Surgical History:  Procedure Laterality Date  . ABDOMINAL HYSTERECTOMY    . partial small bowel resection  2017   complication of umbilical hernia repair  . TUBAL LIGATION    . UMBILICAL HERNIA REPAIR      reports that she has never smoked. She has never used smokeless tobacco. She reports that she drinks alcohol. She reports that she does not use drugs. family history includes Cancer in her father; Hypertension in her mother; Rashes / Skin problems in her daughter; Rheum arthritis in her daughter. Allergies  Allergen Reactions  . Sulfonamide Derivatives Hives   Current Outpatient Medications on File Prior to Visit  Medication Sig Dispense Refill  . albuterol (PROVENTIL HFA;VENTOLIN HFA) 108 (90 Base) MCG/ACT inhaler Inhale 2 puffs into the lungs every 6 (six) hours as needed for wheezing or shortness of breath. 1 Inhaler 12  . amLODipine (NORVASC) 10 MG tablet Take 1 tablet (10 mg total) by mouth daily. 90 tablet 3  . hyoscyamine (LEVSIN) 0.125 MG/5ML ELIX Take 0.125 mg by mouth every 4 (four) hours as needed.    Marland Kitchen ibuprofen (ADVIL,MOTRIN) 800 MG tablet     . meloxicam (MOBIC) 15 MG tablet Take 15 mg by mouth daily.    Marland Kitchen omeprazole (PRILOSEC) 40 MG  capsule Take 40 mg by mouth daily.     No current facility-administered medications on file prior to visit.    Review of Systems  Constitutional: Negative for other unusual diaphoresis or sweats HENT: Negative for ear discharge or swelling Eyes: Negative for other worsening visual disturbances Respiratory: Negative for stridor or other swelling  Gastrointestinal: Negative for worsening distension or other blood Genitourinary: Negative for retention or other urinary change Musculoskeletal: Negative for other MSK pain or swelling Skin: Negative for color change or other new lesions Neurological: Negative for worsening tremors and other numbness  Psychiatric/Behavioral: Negative for worsening agitation or other fatigue All other system neg per pt    Objective:   Physical Exam BP 122/82   Pulse 87   Temp 98.5 F (36.9 C) (Oral)   Ht  (1.575 m)   Wt 211 lb (95.7 kg)   SpO2 99%   BMI 38.59 kg/m  VS noted, not ill but uncomfortable appearing Constitutional: Pt appears in NAD HENT: Head: NCAT.  Right Ear: External ear normal.  Left Ear: External ear normal.  Eyes: . Pupils are equal, round, and reactive to light. Conjunctivae and EOM are normal Nose: without d/c or deformity Neck: Neck supple. Gross normal ROM Cardiovascular: Normal rate and regular rhythm.   Pulmonary/Chest: Effort normal and breath sounds without rales or wheezing.  Abd:  Soft, NT, ND, + BS, no organomegaly Neurological: Pt is alert. At baseline  orientation, motor grossly intact Skin: Skin is warm. + few hive lesions to arms rashes, other new lesions, trace bilat LE edema Psychiatric: Pt behavior is normal without agitation  No other exam findings    Assessment & Plan:

## 2018-01-07 NOTE — Assessment & Plan Note (Signed)
/  Mild to mod, for predpac asd,  to f/u any worsening symptoms or concerns 

## 2018-01-07 NOTE — Patient Instructions (Addendum)
You had the steroid shot today  Please take all new medication as prescribed - the prednisone, HCt 12.5 mg fluid pill, generic crestor 10 mg per day, and nasacort  Please continue all other medications as before, and refills have been done if requested.  Please have the pharmacy call with any other refills you may need.  Please continue your efforts at being more active, low cholesterol diet, and weight control.  Please keep your appointments with your specialists as you may have planned  Please return in 9 months, or sooner if needed, with Lab testing done 3-5 days before

## 2018-01-07 NOTE — Assessment & Plan Note (Signed)
stable overall by history and exam, and pt to continue medical treatment as before,  to f/u any worsening symptoms or concerns 

## 2018-01-07 NOTE — Assessment & Plan Note (Signed)
Mild to mod, for crestor 10 qd,  to f/u any worsening symptoms or concerns 

## 2018-01-07 NOTE — Assessment & Plan Note (Addendum)
Has some pedal edema but declines change in amldoipine, to add hct 12.5 qd, o/w stable overall by history and exam, recent data reviewed with pt, and pt to continue medical treatment as before,  to f/u any worsening symptoms or concerns BP Readings from Last 3 Encounters:  01/07/18 122/82  09/16/17 124/86  07/09/17 (!) 160/100

## 2018-03-06 ENCOUNTER — Ambulatory Visit: Payer: BLUE CROSS/BLUE SHIELD | Admitting: Internal Medicine

## 2018-03-06 ENCOUNTER — Encounter: Payer: Self-pay | Admitting: Internal Medicine

## 2018-03-06 ENCOUNTER — Ambulatory Visit (INDEPENDENT_AMBULATORY_CARE_PROVIDER_SITE_OTHER)
Admission: RE | Admit: 2018-03-06 | Discharge: 2018-03-06 | Disposition: A | Discharge status: Home / Self Care | Payer: BLUE CROSS/BLUE SHIELD | Source: Ambulatory Visit | Attending: Internal Medicine | Admitting: Internal Medicine

## 2018-03-06 DIAGNOSIS — J45909 Unspecified asthma, uncomplicated: Secondary | ICD-10-CM | POA: Diagnosis not present

## 2018-03-06 DIAGNOSIS — J309 Allergic rhinitis, unspecified: Secondary | ICD-10-CM | POA: Diagnosis not present

## 2018-03-06 DIAGNOSIS — R079 Chest pain, unspecified: Secondary | ICD-10-CM | POA: Diagnosis not present

## 2018-03-06 MED ORDER — METHYLPREDNISOLONE ACETATE 80 MG/ML IJ SUSP
80.0000 mg | Freq: Once | INTRAMUSCULAR | Status: AC
  Administered 2018-03-06: 80 mg via INTRAMUSCULAR

## 2018-03-06 MED ORDER — PREDNISONE 10 MG PO TABS: ORAL_TABLET | ORAL | 0 refills | Status: DC

## 2018-03-06 MED ORDER — BUDESONIDE-FORMOTEROL FUMARATE 160-4.5 MCG/ACT IN AERO: 2.0000 | INHALATION_SPRAY | Freq: Two times a day (BID) | RESPIRATORY_TRACT | 11 refills | Status: DC

## 2018-03-06 MED ORDER — ALBUTEROL SULFATE HFA 108 (90 BASE) MCG/ACT IN AERS: 2.0000 | INHALATION_SPRAY | Freq: Four times a day (QID) | RESPIRATORY_TRACT | 12 refills | Status: DC | PRN

## 2018-03-06 NOTE — Patient Instructions (Signed)
You had the steroid shot today  Please take all new medication as prescribed - the prednisone  Please take all new medication as prescribed - the Symbicort with regular use, but you could consider stopping after 1 mo if no further symptoms  You can also take Delsym OTC for cough, and/or Mucinex (or it's generic off brand) for congestion, and tylenol as needed for pain.  Your EKG was OK today  Please continue all other medications as before, and refills have been done if requested.  Please have the pharmacy call with any other refills you may need.  Please continue your efforts at being more active, low cholesterol diet, and weight control.  You are otherwise up to date with prevention measures today.  Please keep your appointments with your specialists as you may have planned  Please go to the XRAY Department in the Basement (go straight as you get off the elevator) for the x-ray testing  You will be contacted by phone if any changes need to be made immediately.  Otherwise, you will receive a letter about your results with an explanation, but please check with MyChart first.  Please remember to sign up for MyChart if you have not done so, as this will be important to you in the future with finding out test results, communicating by private email, and scheduling acute appointments online when needed.

## 2018-03-06 NOTE — Assessment & Plan Note (Signed)
With mild flare, for cont albuterol and add symbicort for mild persistent asthma

## 2018-03-06 NOTE — Assessment & Plan Note (Signed)
Also to improve with steorid tx

## 2018-03-06 NOTE — Progress Notes (Addendum)
Subjective:    Patient ID: Ellen Fernandez, female    DOB: [birth year], 47 y.o.   MRN: [id number]  HPI  Here with mild to mod sharp right lateral CP yesterday and all night - ? Cardiac or gas or too tight bra, finally got to sleep, also with occas cough non prod better with inhaler;  Intermittent piercing , worse with deeper breathing.  No heavy lifting or other unsual activity as [employer].  No fever, wheezing, sob but has some mild incresse in DOE with stairs at work. Does have several wks ongoing nasal allergy symptoms with clearish congestion, itch and sneezing, without fever, pain, ST, cough, swelling Wt Readings from Last 3 Encounters:  03/06/18 210 lb (95.3 kg)  01/07/18 211 lb (95.7 kg)  09/16/17 211 lb (95.7 kg)   Past Medical History:  Diagnosis Date  . Asthma   . GERD (gastroesophageal reflux disease)   . HTN (hypertension)   . Hyperlipidemia    Past Surgical History:  Procedure Laterality Date  . ABDOMINAL HYSTERECTOMY    . partial small bowel resection  2017   complication of umbilical hernia repair  . TUBAL LIGATION    . UMBILICAL HERNIA REPAIR      reports that she has never smoked. She has never used smokeless tobacco. She reports that she drinks alcohol. She reports that she does not use drugs. family history includes Cancer in her father; Hypertension in her mother; Rashes / Skin problems in her daughter; Rheum arthritis in her daughter. Allergies  Allergen Reactions  . Sulfonamide Derivatives Hives   Current Outpatient Medications on File Prior to Visit  Medication Sig Dispense Refill  . amLODipine (NORVASC) 10 MG tablet Take 1 tablet (10 mg total) by mouth daily. 90 tablet 3  . hydrochlorothiazide (MICROZIDE) 12.5 MG capsule Take 1 capsule (12.5 mg total) by mouth daily. 90 capsule 3  . hyoscyamine (LEVSIN) 0.125 MG/5ML ELIX Take 0.125 mg by mouth every 4 (four) hours as needed.    Marland Kitchen ibuprofen (ADVIL,MOTRIN) 800 MG tablet     . meloxicam (MOBIC) 15 MG tablet  Take 15 mg by mouth daily.    Marland Kitchen omeprazole (PRILOSEC) 40 MG capsule Take 40 mg by mouth daily.    . rosuvastatin (CRESTOR) 10 MG tablet Take 1 tablet (10 mg total) by mouth daily. 90 tablet 3  . triamcinolone (NASACORT) 55 MCG/ACT AERO nasal inhaler Place 2 sprays into the nose daily. 1 Inhaler 12   No current facility-administered medications on file prior to visit.    Review of Systems  Constitutional: Negative for other unusual diaphoresis or sweats HENT: Negative for ear discharge or swelling Eyes: Negative for other worsening visual disturbances Respiratory: Negative for stridor or other swelling  Gastrointestinal: Negative for worsening distension or other blood Genitourinary: Negative for retention or other urinary change Musculoskeletal: Negative for other MSK pain or swelling Skin: Negative for color change or other new lesions Neurological: Negative for worsening tremors and other numbness  Psychiatric/Behavioral: Negative for worsening agitation or other fatigue All other system neg per pt    Objective:   Physical Exam BP 128/86   Pulse 94   Temp 98.3 F (36.8 C) (Oral)   Ht 5\' 2"  (1.575 m)   Wt 210 lb (95.3 kg)   SpO2 98%   BMI 38.41 kg/m  VS noted, not ill appearing Constitutional: Pt appears in NAD HENT: Head: NCAT.  Right Ear: External ear normal.  Left Ear: External ear normal.  Eyes: .  Pupils are equal, round, and reactive to light. Conjunctivae and EOM are normal Nose: without d/c or deformity Neck: Neck supple. Gross normal ROM Cardiovascular: Normal rate and regular rhythm.   Pulmonary/Chest: Effort normal and breath sounds decreased without rales or wheezing.  Neurological: Pt is alert. At baseline orientation, motor grossly intact Skin: Skin is warm. No rashes, other new lesions, no LE edema Psychiatric: Pt behavior is normal without agitation  No other exam findings Lab Results  Component Value Date   WBC 10.3 06/22/2011   HGB 13.3 06/22/2011    HCT 38.7 06/22/2011   PLT 299 06/22/2011   GLUCOSE 86 07/09/2017   CHOL 226 (H) 07/09/2017   TRIG 128.0 07/09/2017   HDL 68.20 07/09/2017   LDLCALC 132 (H) 07/09/2017   ALT 77 (H) 06/22/2011   AST 57 (H) 06/22/2011   NA 141 07/09/2017   K 3.9 07/09/2017   CL 100 07/09/2017   CREATININE 0.80 07/09/2017   BUN 10 07/09/2017   CO2 29 07/09/2017   TSH 2.98 07/09/2017   ECG today I have personally interpreted NSR - no ischemic changes    Assessment & Plan:

## 2018-03-06 NOTE — Assessment & Plan Note (Signed)
Mild, suspect related to MSK from cough and asthma flare, ecg reviewed, for cxr as well

## 2018-03-07 ENCOUNTER — Encounter: Payer: Self-pay | Admitting: Internal Medicine

## 2018-03-07 ENCOUNTER — Other Ambulatory Visit: Payer: Self-pay | Admitting: Internal Medicine

## 2018-03-07 ENCOUNTER — Telehealth: Payer: Self-pay

## 2018-03-07 MED ORDER — AZITHROMYCIN 250 MG PO TABS: ORAL_TABLET | ORAL | 1 refills | Status: DC

## 2018-03-07 NOTE — Telephone Encounter (Signed)
-----   Message from Corwin LevinsJames W John, MD sent at 03/07/2018 12:43 PM EDT ----- Letter sent, cont same tx except  The test results show that your current treatment is OK, except although the xray is not definite, it is suggestive of possible pneumonia.  I will send an antibiotic and you should hear from the office as well.    Shirron to please inform pt, I will do rx

## 2018-03-07 NOTE — Telephone Encounter (Signed)
Called pt, left detailed msg with details below.

## 2018-03-18 ENCOUNTER — Encounter: Payer: Self-pay | Admitting: Nurse Practitioner

## 2018-03-18 ENCOUNTER — Telehealth: Payer: Self-pay | Admitting: Internal Medicine

## 2018-03-18 ENCOUNTER — Ambulatory Visit: Payer: BLUE CROSS/BLUE SHIELD | Admitting: Nurse Practitioner

## 2018-03-18 ENCOUNTER — Ambulatory Visit: Payer: Self-pay

## 2018-03-18 VITALS — BP 136/84 | HR 98 | Temp 98.5°F | Resp 18 | Ht 62.0 in | Wt 210.0 lb

## 2018-03-18 DIAGNOSIS — J189 Pneumonia, unspecified organism: Secondary | ICD-10-CM | POA: Diagnosis not present

## 2018-03-18 MED ORDER — DOXYCYCLINE HYCLATE 100 MG PO TABS: 100.0000 mg | ORAL_TABLET | Freq: Two times a day (BID) | ORAL | 0 refills | Status: DC

## 2018-03-18 NOTE — Progress Notes (Signed)
Name: Ellen Fernandez   MRN: [id number]    DOB: 1971-09-01   Date:03/18/2018       Progress Note  Subjective  Chief Complaint  Chief Complaint  Patient presents with  . Fatigue    still having cough, fever, SOB, fatigue, and the tightness in chest    HPI  Ms Ellen Fernandez is here today for evaluation of cough, chest tightness and shortness of breath, first seen for these symptoms by PCP on 03/06/18 with normal EKG and chest xray indicating possible pneumonia. She was treated with a methylprednisolone injection in office on 03/06/18 and prescribed course of z-pak and  Prednisone and symbicort inhaler to use at home. She completed the z-pak and prednisone course and initially felt somewhat better, however this week she has noticed  feeling worse again and return of symptoms including low grade fevers, chills, body aches, nonproductive cough, shortness of breath, wheezing.  She is still waiting for the symbicort to be filled by her work pharmacy. She is using her albuterol inhaler about TID since her symptoms began. She denies weakness, syncope, tachycardia, palpitations, abdominal pain, nausea, vomiting, diarrhea, sore throat.  Patient Active Problem List   Diagnosis Date Noted  . Chest pain 03/06/2018  . Hives 01/07/2018  . Encounter for well adult exam with abnormal findings 09/16/2017  . HTN (hypertension)   . Hyperlipidemia   . GERD (gastroesophageal reflux disease)   . Asthma   . Allergic rhinitis 10/26/2008  . HEADACHE, CHRONIC 10/26/2008    Social History   Tobacco Use  . Smoking status: Never Smoker  . Smokeless tobacco: Never Used  Substance Use Topics  . Alcohol use: Yes     Current Outpatient Medications:  .  albuterol (PROVENTIL HFA;VENTOLIN HFA) 108 (90 Base) MCG/ACT inhaler, Inhale 2 puffs into the lungs every 6 (six) hours as needed for wheezing or shortness of breath., Disp: 1 Inhaler, Rfl: 12 .  amLODipine (NORVASC) 10 MG tablet, Take 1 tablet (10 mg total) by mouth daily.,  Disp: 90 tablet, Rfl: 3 .  azithromycin (ZITHROMAX Z-PAK) 250 MG tablet, 2 tab by mouth day 1, then 1 per day, Disp: 6 tablet, Rfl: 1 .  budesonide-formoterol (SYMBICORT) 160-4.5 MCG/ACT inhaler, Inhale 2 puffs into the lungs 2 (two) times daily., Disp: 1 Inhaler, Rfl: 11 .  hydrochlorothiazide (MICROZIDE) 12.5 MG capsule, Take 1 capsule (12.5 mg total) by mouth daily., Disp: 90 capsule, Rfl: 3 .  hyoscyamine (LEVSIN) 0.125 MG/5ML ELIX, Take 0.125 mg by mouth every 4 (four) hours as needed., Disp: , Rfl:  .  ibuprofen (ADVIL,MOTRIN) 800 MG tablet, , Disp: , Rfl:  .  meloxicam (MOBIC) 15 MG tablet, Take 15 mg by mouth daily., Disp: , Rfl:  .  omeprazole (PRILOSEC) 40 MG capsule, Take 40 mg by mouth daily., Disp: , Rfl:  .  predniSONE (DELTASONE) 10 MG tablet, 3 tabs by mouth per day for 3 days,2tabs per day for 3 days,1tab per day for 3 days, Disp: 18 tablet, Rfl: 0 .  rosuvastatin (CRESTOR) 10 MG tablet, Take 1 tablet (10 mg total) by mouth daily., Disp: 90 tablet, Rfl: 3 .  triamcinolone (NASACORT) 55 MCG/ACT AERO nasal inhaler, Place 2 sprays into the nose daily., Disp: 1 Inhaler, Rfl: 12  Allergies  Allergen Reactions  . Sulfonamide Derivatives Hives    ROS   No other specific complaints in a complete review of systems (except as listed in HPI above).  Objective  Vitals:   03/18/18 1158  BP: 136/84  Pulse: 98  Resp: 18  Temp: 98.5 F (36.9 C)  TempSrc: Oral  SpO2: 94%  Weight: 210 lb (95.3 kg)  Height: 5\' 2"  (1.575 m)    Body mass index is 38.41 kg/m.  Nursing Note and Vital Signs reviewed.  Physical Exam  Constitutional: Patient appears well-developed and well-nourished.  No distress.  HEENT: head atraumatic, normocephalic, pupils equal and reactive to light, EOM's intact, TM's without erythema or bulging,  no maxillary or frontal sinus tenderness , neck supple without lymphadenopathy, oropharynx pink and moist without exudate Cardiovascular: Normal rate, regular  rhythm, distal pulses intact. Pulmonary/Chest: Effort normal and breath sounds clear. No respiratory distress or retractions. Neurological: She is alert and oriented to person, place, and time. No cranial nerve deficit. Coordination, balance, strength, speech and gait are normal.  Skin: Skin is warm and dry. No rash noted. No erythema.  Psychiatric: Patient has a normal mood and affect. behavior is normal. Judgment and thought content normal.   Assessment & Plan  1. Pneumonia due to infectious organism, unspecified laterality, unspecified part of lung Given possible pneumonia on recent CXR with recurrent symptoms after azithromycin course, will start treatment with second course of antibiotics today-doxycyline Rx sent with dosing and side effects discussed Home management of pneumonia, Red flags, and when to present for emergency care or RTC including fever >101.53F, new/worsening/un-resolving symptoms, reviewed with patient at time of visit. Follow up and care instructions discussed and provided in AVS. - doxycycline (VIBRA-TABS) 100 MG tablet; Take 1 tablet (100 mg total) by mouth 2 (two) times daily.  Dispense: 20 tablet; Refill: 0

## 2018-03-18 NOTE — Patient Instructions (Addendum)
Please start symbicort as instructed by Dr Jonny RuizJohn.  Please start doxycycline 100 mg twice daily for 10 days.  Please follow up for fevers >101, if your symptoms start to get worse, or if you are not feeling better by next week.   Community-Acquired Pneumonia, Adult Pneumonia is an infection of the lungs. One type of pneumonia can happen while a person is in a hospital. A different type can happen when a person is not in a hospital (community-acquired pneumonia). It is easy for this kind to spread from person to person. It can spread to you if you breathe near an infected person who coughs or sneezes. Some symptoms include:  A dry cough.  A wet (productive) cough.  Fever.  Sweating.  Chest pain.  Follow these instructions at home:  Take over-the-counter and prescription medicines only as told by your doctor. ? Only take cough medicine if you are losing sleep. ? If you were prescribed an antibiotic medicine, take it as told by your doctor. Do not stop taking the antibiotic even if you start to feel better.  Sleep with your head and neck raised (elevated). You can do this by putting a few pillows under your head, or you can sleep in a recliner.  Do not use tobacco products. These include cigarettes, chewing tobacco, and e-cigarettes. If you need help quitting, ask your doctor.  Drink enough water to keep your pee (urine) clear or pale yellow. A shot (vaccine) can help prevent pneumonia. Shots are often suggested for:  People older than 48 years of age.  People older than 48 years of age: ? Who are having cancer treatment. ? Who have long-term (chronic) lung disease. ? Who have problems with their body's defense system (immune system).  You may also prevent pneumonia if you take these actions:  Get the flu (influenza) shot every year.  Go to the dentist as often as told.  Wash your hands often. If soap and water are not available, use hand sanitizer.  Contact a doctor  if:  You have a fever.  You lose sleep because your cough medicine does not help. Get help right away if:  You are short of breath and it gets worse.  You have more chest pain.  Your sickness gets worse. This is very serious if: ? You are an older adult. ? Your body's defense system is weak.  You cough up blood. This information is not intended to replace advice given to you by your health care provider. Make sure you discuss any questions you have with your health care provider. Document Released: 02/13/2008 Document Revised: 02/02/2016 Document Reviewed: 12/22/2014 Elsevier Interactive Patient Education  [employer]2018 [employer] Inc.

## 2018-03-18 NOTE — Telephone Encounter (Signed)
Ok for return to work March 31, 2018

## 2018-03-18 NOTE — Telephone Encounter (Signed)
Patient has dropped off FMLA forms to be completed. She had a follow up with [place] today (03/18/18) from her visit on 03/06/18. Patient has Pneumonia and been placed on another 10 day ABX. She has not been to work since 03/06/18, when would you like for the return work date to be. I spoke with Ladona Ridgelaylor since [place] has already left the office for the day and she stated it was best if Dr.John gave a return to work date. Please advise.

## 2018-03-18 NOTE — Telephone Encounter (Signed)
Pt. Reports she was recently diagnosed with pneumonia and just finished her antibiotic. Still having fevers on and off. Wheezing and shortness of breath with exertion. Easily fatigued. History of asthma. Appointment made for today.  Reason for Disposition . SEVERE coughing spells (e.g., whooping sound after coughing, vomiting after coughing)  Answer Assessment - Initial Assessment Questions 1. ONSET: "When did the cough begin?"      Started 2 weeks ago 2. SEVERITY: "How bad is the cough today?"      Severe 3. RESPIRATORY DISTRESS: "Describe your breathing."      Gets short of breath with exertion 4. FEVER: "Do you have a fever?" If so, ask: "What is your temperature, how was it measured, and when did it start?"     101.2 5. HEMOPTYSIS: "Are you coughing up any blood?" If so ask: "How much?" (flecks, streaks, tablespoons, etc.)     No 6. TREATMENT: "What have you done so far to treat the cough?" (e.g., meds, fluids, humidifier)     Antibiotic 7. CARDIAC HISTORY: "Do you have any history of heart disease?" (e.g., heart attack, congestive heart failure)      HTN 8. LUNG HISTORY: "Do you have any history of lung disease?"  (e.g., pulmonary embolus, asthma, emphysema)     Asthma  9. PE RISK FACTORS: "Do you have a history of blood clots?" (or: recent major surgery, recent prolonged travel, bedridden)     2014 PE 10. OTHER SYMPTOMS: "Do you have any other symptoms? (e.g., runny nose, wheezing, chest pain)       Wheezing 11. PREGNANCY: "Is there any chance you are pregnant?" "When was your last menstrual period?"       No 12. TRAVEL: "Have you traveled out of the country in the last month?" (e.g., travel history, exposures)       No  Protocols used: COUGH - ACUTE NON-PRODUCTIVE-A-AH

## 2018-03-19 DIAGNOSIS — Z0279 Encounter for issue of other medical certificate: Secondary | ICD-10-CM

## 2018-03-19 NOTE — Telephone Encounter (Signed)
Forms have been signed, faxed to [employer] @ [phone] ,Copy sent to scan &charged for.   LVM to inform patient & her original forms are ready to be picked up.

## 2018-03-19 NOTE — Telephone Encounter (Signed)
Forms have been completed & placed in providers box to sign.  °

## 2018-07-14 ENCOUNTER — Other Ambulatory Visit: Payer: Self-pay | Admitting: Internal Medicine

## 2018-07-14 NOTE — Telephone Encounter (Signed)
Copied from CRM [id number]. Topic: Quick Communication - Rx Refill/Question >> Jul 14, 2018 12:17 PM Baldo Daub L wrote: Medication: amLODipine (NORVASC) 10 MG tablet  Has the patient contacted their pharmacy? No - new pharmacy (Agent: If no, request that the patient contact the pharmacy for the refill.) (Agent: If yes, when and what did the pharmacy advise?)  Preferred Pharmacy (with phone number or street name): [id number] [place], [place] - [address] AT [facility] OF [facility] & [address] [phone] (Phone) [phone] (Fax)  Agent: Please be advised that RX refills may take up to 3 business days. We ask that you follow-up with your pharmacy.

## 2018-07-15 MED ORDER — AMLODIPINE BESYLATE 10 MG PO TABS: 10.0000 mg | ORAL_TABLET | Freq: Every day | ORAL | 0 refills | Status: DC

## 2018-07-16 ENCOUNTER — Encounter: Payer: BLUE CROSS/BLUE SHIELD | Admitting: Internal Medicine

## 2018-08-08 ENCOUNTER — Other Ambulatory Visit: Payer: Self-pay | Admitting: Internal Medicine

## 2018-09-18 ENCOUNTER — Other Ambulatory Visit (INDEPENDENT_AMBULATORY_CARE_PROVIDER_SITE_OTHER): Payer: PRIVATE HEALTH INSURANCE

## 2018-09-18 ENCOUNTER — Ambulatory Visit (INDEPENDENT_AMBULATORY_CARE_PROVIDER_SITE_OTHER): Payer: PRIVATE HEALTH INSURANCE | Admitting: Internal Medicine

## 2018-09-18 ENCOUNTER — Telehealth: Payer: Self-pay

## 2018-09-18 ENCOUNTER — Encounter: Payer: Self-pay | Admitting: Internal Medicine

## 2018-09-18 ENCOUNTER — Telehealth: Payer: Self-pay | Admitting: Internal Medicine

## 2018-09-18 VITALS — BP 122/84 | HR 87 | Temp 98.1°F | Ht 62.0 in | Wt 206.0 lb

## 2018-09-18 DIAGNOSIS — J45909 Unspecified asthma, uncomplicated: Secondary | ICD-10-CM

## 2018-09-18 DIAGNOSIS — J309 Allergic rhinitis, unspecified: Secondary | ICD-10-CM

## 2018-09-18 DIAGNOSIS — M67431 Ganglion, right wrist: Secondary | ICD-10-CM | POA: Insufficient documentation

## 2018-09-18 DIAGNOSIS — Z0001 Encounter for general adult medical examination with abnormal findings: Secondary | ICD-10-CM

## 2018-09-18 DIAGNOSIS — I10 Essential (primary) hypertension: Secondary | ICD-10-CM

## 2018-09-18 LAB — LIPID PANEL
CHOL/HDL RATIO: 3
Cholesterol: 218 mg/dL — ABNORMAL HIGH (ref 0–200)
HDL: 65.8 mg/dL (ref >39.00)
LDL Cholesterol: 128 mg/dL — ABNORMAL HIGH (ref 0–99)
NonHDL: 151.93
Triglycerides: 121 mg/dL (ref 0.0–149.0)
VLDL: 24.2 mg/dL (ref 0.0–40.0)

## 2018-09-18 LAB — URINALYSIS, ROUTINE W REFLEX MICROSCOPIC
Bilirubin Urine: NEGATIVE
Hgb urine dipstick: NEGATIVE
Ketones, ur: NEGATIVE
Leukocytes, UA: NEGATIVE
Nitrite: NEGATIVE
RBC / HPF: NONE SEEN (ref 0–?)
Specific Gravity, Urine: <=1.005 — AB (ref 1.000–1.030)
Total Protein, Urine: NEGATIVE
UROBILINOGEN UA: 0.2 (ref 0.0–1.0)
Urine Glucose: NEGATIVE
pH: 6.5 (ref 5.0–8.0)

## 2018-09-18 LAB — HEPATIC FUNCTION PANEL
ALT: 15 U/L (ref 0–35)
AST: 16 U/L (ref 0–37)
Albumin: 4.4 g/dL (ref 3.5–5.2)
Alkaline Phosphatase: 78 U/L (ref 39–117)
Bilirubin, Direct: 0.1 mg/dL (ref 0.0–0.3)
Total Bilirubin: 0.5 mg/dL (ref 0.2–1.2)
Total Protein: 8.1 g/dL (ref 6.0–8.3)

## 2018-09-18 LAB — CBC WITH DIFFERENTIAL/PLATELET
BASOS PCT: 0.8 % (ref 0.0–3.0)
Basophils Absolute: 0.1 10*3/uL (ref 0.0–0.1)
Eosinophils Absolute: 0.2 10*3/uL (ref 0.0–0.7)
Eosinophils Relative: 1.9 % (ref 0.0–5.0)
HCT: 44.8 % (ref 36.0–46.0)
HEMOGLOBIN: 15.1 g/dL — AB (ref 12.0–15.0)
Lymphocytes Relative: 23 % (ref 12.0–46.0)
Lymphs Abs: 2.3 10*3/uL (ref 0.7–4.0)
MCHC: 33.6 g/dL (ref 30.0–36.0)
MCV: 90.1 fl (ref 78.0–100.0)
Monocytes Absolute: 0.6 10*3/uL (ref 0.1–1.0)
Monocytes Relative: 5.8 % (ref 3.0–12.0)
Neutro Abs: 6.8 10*3/uL (ref 1.4–7.7)
Neutrophils Relative %: 68.5 % (ref 43.0–77.0)
Platelets: 326 10*3/uL (ref 150.0–400.0)
RBC: 4.98 Mil/uL (ref 3.87–5.11)
RDW: 15.3 % (ref 11.5–15.5)
WBC: 10 10*3/uL (ref 4.0–10.5)

## 2018-09-18 LAB — BASIC METABOLIC PANEL
BUN: 9 mg/dL (ref 6–23)
CO2: 26 mEq/L (ref 19–32)
Calcium: 9.6 mg/dL (ref 8.4–10.5)
Chloride: 102 mEq/L (ref 96–112)
Creatinine, Ser: 0.78 mg/dL (ref 0.40–1.20)
GFR: 101.22 mL/min (ref >60.00)
Glucose, Bld: 91 mg/dL (ref 70–99)
Potassium: 3.9 mEq/L (ref 3.5–5.1)
Sodium: 137 mEq/L (ref 135–145)

## 2018-09-18 LAB — TSH: TSH: 2.72 u[IU]/mL (ref 0.35–4.50)

## 2018-09-18 MED ORDER — ROSUVASTATIN CALCIUM 10 MG PO TABS: 10.0000 mg | ORAL_TABLET | Freq: Every day | ORAL | 3 refills | Status: DC

## 2018-09-18 MED ORDER — HYDROCHLOROTHIAZIDE 12.5 MG PO CAPS: 12.5000 mg | ORAL_CAPSULE | Freq: Every day | ORAL | 3 refills | Status: DC

## 2018-09-18 MED ORDER — MELOXICAM 15 MG PO TABS: 15.0000 mg | ORAL_TABLET | Freq: Every day | ORAL | 3 refills | Status: DC

## 2018-09-18 MED ORDER — OMEPRAZOLE 40 MG PO CPDR: 40.0000 mg | DELAYED_RELEASE_CAPSULE | Freq: Every day | ORAL | 3 refills | Status: DC

## 2018-09-18 MED ORDER — BUDESONIDE-FORMOTEROL FUMARATE 160-4.5 MCG/ACT IN AERO: 2.0000 | INHALATION_SPRAY | Freq: Two times a day (BID) | RESPIRATORY_TRACT | 11 refills | Status: DC

## 2018-09-18 MED ORDER — AMLODIPINE BESYLATE 10 MG PO TABS: 10.0000 mg | ORAL_TABLET | Freq: Every day | ORAL | 3 refills | Status: DC

## 2018-09-18 MED ORDER — ALBUTEROL SULFATE HFA 108 (90 BASE) MCG/ACT IN AERS: 2.0000 | INHALATION_SPRAY | Freq: Four times a day (QID) | RESPIRATORY_TRACT | 12 refills | Status: DC | PRN

## 2018-09-18 MED ORDER — TRIAMCINOLONE ACETONIDE 55 MCG/ACT NA AERO: 2.0000 | INHALATION_SPRAY | Freq: Every day | NASAL | 11 refills | Status: DC

## 2018-09-18 MED ORDER — AZELASTINE HCL 0.05 % OP SOLN: 1.0000 [drp] | Freq: Two times a day (BID) | OPHTHALMIC | 12 refills | Status: DC

## 2018-09-18 MED ORDER — IBUPROFEN 800 MG PO TABS: 800.0000 mg | ORAL_TABLET | Freq: Every day | ORAL | 3 refills | Status: AC | PRN

## 2018-09-18 MED ORDER — METHYLPREDNISOLONE ACETATE 80 MG/ML IJ SUSP
80.0000 mg | Freq: Once | INTRAMUSCULAR | Status: AC
  Administered 2018-09-18: 80 mg via INTRAMUSCULAR

## 2018-09-18 NOTE — Telephone Encounter (Signed)
-----   Message from Corwin Levins, MD sent at 09/18/2018  1:01 PM EST ----- Left message on MyChart, pt to cont same tx except  The test results show that your current treatment is OK, except the LDL cholesterol is still mildly elevated Please increase the crestor to 20 mg per day, and follow a lower cholesterol diet.    [place] Mcphee to please inform pt, I will do rx

## 2018-09-18 NOTE — Progress Notes (Signed)
Subjective:    Patient ID: Ellen Fernandez, female    DOB: 1971-06-09, 48 y.o.   MRN: [id number]  HPI  Here for wellness and f/u;  Overall doing ok;  Pt denies Chest pain, worsening SOB, DOE, wheezing, orthopnea, PND, worsening LE edema, palpitations, dizziness or syncope.  Pt denies neurological change such as new headache, facial or extremity weakness.  Pt denies polydipsia, polyuria, or low sugar symptoms. Pt states overall good compliance with treatment and medications, good tolerability, and has been trying to follow appropriate diet.  Pt denies worsening depressive symptoms, suicidal ideation or panic. No fever, night sweats, wt loss, loss of appetite, or other constitutional symptoms.  Pt states good ability with ADL's, has low fall risk, home safety reviewed and adequate, no other significant changes in hearing or vision, and only occasionally active with exercise. Wt Readings from Last 3 Encounters:  09/18/18 206 lb (93.4 kg)  03/18/18 210 lb (95.3 kg)  03/06/18 210 lb (95.3 kg)  Does have several wks ongoing nasal allergy symptoms with clearish congestion, itch and sneezing, without fever, pain, ST, cough, swelling or wheezing. Past Medical History:  Diagnosis Date  . Asthma   . GERD (gastroesophageal reflux disease)   . HTN (hypertension)   . Hyperlipidemia    Past Surgical History:  Procedure Laterality Date  . ABDOMINAL HYSTERECTOMY    . partial small bowel resection  2017   complication of umbilical hernia repair  . TUBAL LIGATION    . UMBILICAL HERNIA REPAIR      reports that she has never smoked. She has never used smokeless tobacco. She reports current alcohol use. She reports that she does not use drugs. family history includes Cancer in her father; Hypertension in her mother; Rashes / Skin problems in her daughter; Rheum arthritis in her daughter. Allergies  Allergen Reactions  . Sulfonamide Derivatives Hives   Current Outpatient Medications on File Prior to Visit    Medication Sig Dispense Refill  . hyoscyamine (LEVSIN) 0.125 MG/5ML ELIX Take 0.125 mg by mouth every 4 (four) hours as needed.     No current facility-administered medications on file prior to visit.    Review of Systems Constitutional: Negative for other unusual diaphoresis, sweats, appetite or weight changes HENT: Negative for other worsening hearing loss, ear pain, facial swelling, mouth sores or neck stiffness.   Eyes: Negative for other worsening pain, redness or other visual disturbance.  Respiratory: Negative for other stridor or swelling Cardiovascular: Negative for other palpitations or other chest pain  Gastrointestinal: Negative for worsening diarrhea or loose stools, blood in stool, distention or other pain Genitourinary: Negative for hematuria, flank pain or other change in urine volume.  Musculoskeletal: Negative for myalgias or other joint swelling.  Skin: Negative for other color change, or other wound or worsening drainage.  Neurological: Negative for other syncope or numbness. Hematological: Negative for other adenopathy or swelling Psychiatric/Behavioral: Negative for hallucinations, other worsening agitation, SI, self-injury, or new decreased concentration All other system neg per pt    Objective:   Physical Exam BP 122/84   Pulse 87   Temp 98.1 F (36.7 C) (Oral)   Ht 5\' 2"  (1.575 m)   Wt 206 lb (93.4 kg)   SpO2 95%   BMI 37.68 kg/m  VS noted,  Constitutional: Pt is oriented to person, place, and time. Appears well-developed and well-nourished, in no significant distress and comfortable Head: Normocephalic and atraumatic  Eyes: Conjunctivae and EOM are normal. Pupils are equal, round, and  reactive to light Right Ear: External ear normal without discharge Left Ear: External ear normal without discharge Nose: Nose without discharge or deformity Bilat tm's with mild erythema.  Max sinus areas non tender.  Pharynx with mild erythema, no exudate Mouth/Throat:  Oropharynx is without other ulcerations and moist  Neck: Normal range of motion. Neck supple. No JVD present. No tracheal deviation present or significant neck LA or mass Cardiovascular: Normal rate, regular rhythm, normal heart sounds and intact distal pulses.   Pulmonary/Chest: WOB normal and breath sounds without rales or wheezing  Abdominal: Soft. Bowel sounds are normal. NT. No HSM  Musculoskeletal: Normal range of motion. Exhibits no edema, right wrist has tender raised mass c/w ganglion cyst over radial nerve Lymphadenopathy: Has no other cervical adenopathy.  Neurological: Pt is alert and oriented to person, place, and time. Pt has normal reflexes. No cranial nerve deficit. Motor grossly intact, Gait intact Skin: Skin is warm and dry. No rash noted or new ulcerations Psychiatric:  Has normal mood and affect. Behavior is normal without agitation No other exam findings Lab Results  Component Value Date   WBC 10.0 09/18/2018   HGB 15.1 (H) 09/18/2018   HCT 44.8 09/18/2018   PLT 326.0 09/18/2018   GLUCOSE 91 09/18/2018   CHOL 218 (H) 09/18/2018   TRIG 121.0 09/18/2018   HDL 65.80 09/18/2018   LDLCALC 128 (H) 09/18/2018   ALT 15 09/18/2018   AST 16 09/18/2018   NA 137 09/18/2018   K 3.9 09/18/2018   CL 102 09/18/2018   CREATININE 0.78 09/18/2018   BUN 9 09/18/2018   CO2 26 09/18/2018   TSH 2.72 09/18/2018        Assessment & Plan:

## 2018-09-18 NOTE — Assessment & Plan Note (Signed)

## 2018-09-18 NOTE — Telephone Encounter (Signed)
Pt given results per notes of Dr. Jonny Ruiz on 09/18/18.Unable to document in result note due to result note not being routed to [facility].

## 2018-09-18 NOTE — Telephone Encounter (Signed)
Called pt, LVM.   CRM created.  

## 2018-09-18 NOTE — Patient Instructions (Signed)
You had the steroid shot today  Please take all new medication as prescribed - the optivar for the eyes  You will be contacted regarding the referral for: Hand Surgury  Please continue all other medications as before, and refills have been done if requested.  Please have the pharmacy call with any other refills you may need.  Please continue your efforts at being more active, low cholesterol diet, and weight control.  You are otherwise up to date with prevention measures today.  Please keep your appointments with your specialists as you may have planned  Please go to the LAB in the Basement (turn left off the elevator) for the tests to be done today  You will be contacted by phone if any changes need to be made immediately.  Otherwise, you will receive a letter about your results with an explanation, but please check with MyChart first.  Please remember to sign up for MyChart if you have not done so, as this will be important to you in the future with finding out test results, communicating by private email, and scheduling acute appointments online when needed.  Please return in 1 year for your yearly visit, or sooner if needed, with Lab testing done 3-5 days before

## 2018-09-18 NOTE — Assessment & Plan Note (Signed)
Mild to mod persistent, for hand surgury referral 

## 2018-09-18 NOTE — Assessment & Plan Note (Signed)
stable overall by history and exam, recent data reviewed with pt, and pt to continue medical treatment as before,  to f/u any worsening symptoms or concerns  

## 2018-09-18 NOTE — Assessment & Plan Note (Addendum)
Mild to mod, for depomedrol IM 80, optivar asd,  to f/u any worsening symptoms or concerns  In addition to the time spent performing CPE, I spent an additional 25 minutes face to face,in which greater than 50% of this time was spent in counseling and coordination of care for patient's acute illness as documented, including the differential dx, treatment, further evaluation and other management of allergic rhinitis, right ganglion cyst, HTN, asthma

## 2019-05-15 ENCOUNTER — Ambulatory Visit (INDEPENDENT_AMBULATORY_CARE_PROVIDER_SITE_OTHER): Payer: PRIVATE HEALTH INSURANCE | Admitting: Internal Medicine

## 2019-05-15 ENCOUNTER — Encounter: Payer: Self-pay | Admitting: Internal Medicine

## 2019-05-15 DIAGNOSIS — G4700 Insomnia, unspecified: Secondary | ICD-10-CM | POA: Diagnosis not present

## 2019-05-15 DIAGNOSIS — R059 Cough, unspecified: Secondary | ICD-10-CM | POA: Insufficient documentation

## 2019-05-15 DIAGNOSIS — R062 Wheezing: Secondary | ICD-10-CM | POA: Diagnosis not present

## 2019-05-15 DIAGNOSIS — R05 Cough: Secondary | ICD-10-CM | POA: Diagnosis not present

## 2019-05-15 MED ORDER — PREDNISONE 10 MG PO TABS: ORAL_TABLET | ORAL | 0 refills | Status: DC

## 2019-05-15 MED ORDER — HYDROCODONE-HOMATROPINE 5-1.5 MG/5ML PO SYRP: 5.0000 mL | ORAL_SOLUTION | Freq: Four times a day (QID) | ORAL | 0 refills | Status: AC | PRN

## 2019-05-15 MED ORDER — AZITHROMYCIN 250 MG PO TABS: ORAL_TABLET | ORAL | 1 refills | Status: DC

## 2019-05-15 MED ORDER — ALBUTEROL SULFATE HFA 108 (90 BASE) MCG/ACT IN AERS: 2.0000 | INHALATION_SPRAY | Freq: Four times a day (QID) | RESPIRATORY_TRACT | 11 refills | Status: DC | PRN

## 2019-05-15 MED ORDER — ZOLPIDEM TARTRATE 10 MG PO TABS: 10.0000 mg | ORAL_TABLET | Freq: Every evening | ORAL | 2 refills | Status: DC | PRN

## 2019-05-15 NOTE — Assessment & Plan Note (Signed)
Mild to mod, for restart albuterol HFA prn and predpac asd, to f/u any worsening symptoms or concerns

## 2019-05-15 NOTE — Assessment & Plan Note (Addendum)
Mild to mod, c/w bronchitis vs pna, declines cxr due to pandemic, for antibx course, cough med prn, to f/u any worsening symptoms or concerns

## 2019-05-15 NOTE — Progress Notes (Signed)
Virtual Visit via Video Note  I connected with Ellen Fernandez on 05/15/19 at 10:00 AM EDT by a video enabled telemedicine application and verified that I am speaking with the correct person using two identifiers.  Location: Patient: at home Provider: at office   I discussed the limitations of evaluation and management by telemedicine and the availability of in person appointments. The patient expressed understanding and agreed to proceed.  History of Present Illness: Here with acute onset mild to mod 2-3 days ST, HA, general weakness and malaise, with prod cough greenish sputum, but Pt denies chest pain, increased sob or doe, wheezing, orthopnea, PND, increased LE swelling, palpitations, dizziness or syncope, except for onset mild wheezing, sob since 1-2 days.  Also having much difficutly with stress recently and cannot sleep on top of this with the illness and now asking for med.  Works for [employer] at home and does not mind this, but just more stress with the illness on top of this. Past Medical History:  Diagnosis Date  . Asthma   . GERD (gastroesophageal reflux disease)   . HTN (hypertension)   . Hyperlipidemia    Past Surgical History:  Procedure Laterality Date  . ABDOMINAL HYSTERECTOMY    . partial small bowel resection  2979   complication of umbilical hernia repair  . TUBAL LIGATION    . UMBILICAL HERNIA REPAIR      reports that she has never smoked. She has never used smokeless tobacco. She reports current alcohol use. She reports that she does not use drugs. family history includes Cancer in her father; Hypertension in her mother; Rashes / Skin problems in her daughter; Rheum arthritis in her daughter. Allergies  Allergen Reactions  . Sulfonamide Derivatives Hives   Current Outpatient Medications on File Prior to Visit  Medication Sig Dispense Refill  . amLODipine (NORVASC) 10 MG tablet Take 1 tablet (10 mg total) by mouth daily. 90 tablet 3  . azelastine  (OPTIVAR) 0.05 % ophthalmic solution Place 1 drop into both eyes 2 (two) times daily. 6 mL 12  . budesonide-formoterol (SYMBICORT) 160-4.5 MCG/ACT inhaler Inhale 2 puffs into the lungs 2 (two) times daily. 10.2 g 11  . hydrochlorothiazide (MICROZIDE) 12.5 MG capsule Take 1 capsule (12.5 mg total) by mouth daily. 90 capsule 3  . hyoscyamine (LEVSIN) 0.125 MG/5ML ELIX Take 0.125 mg by mouth every 4 (four) hours as needed.    Marland Kitchen ibuprofen (ADVIL,MOTRIN) 800 MG tablet Take 1 tablet (800 mg total) by mouth daily as needed for moderate pain. 90 tablet 3  . omeprazole (PRILOSEC) 40 MG capsule Take 1 capsule (40 mg total) by mouth daily. 90 capsule 3  . rosuvastatin (CRESTOR) 10 MG tablet Take 1 tablet (10 mg total) by mouth daily. 90 tablet 3  . triamcinolone (NASACORT) 55 MCG/ACT AERO nasal inhaler Place 2 sprays into the nose daily. 1 Inhaler 11   No current facility-administered medications on file prior to visit.     Observations/Objective: Alert, NAD, mild ill, appropriate mood and affect, resps normal, cn 2-12 intact, moves all 4s, no visible rash or swelling Lab Results  Component Value Date   WBC 10.0 09/18/2018   HGB 15.1 (H) 09/18/2018   HCT 44.8 09/18/2018   PLT 326.0 09/18/2018   GLUCOSE 91 09/18/2018   CHOL 218 (H) 09/18/2018   TRIG 121.0 09/18/2018   HDL 65.80 09/18/2018   LDLCALC 128 (H) 09/18/2018   ALT 15 09/18/2018   AST 16 09/18/2018   NA 137  09/18/2018   K 3.9 09/18/2018   CL 102 09/18/2018   CREATININE 0.78 09/18/2018   BUN 9 09/18/2018   CO2 26 09/18/2018   TSH 2.72 09/18/2018   Assessment and Plan: See notes  Follow Up Instructions: See notes   I discussed the assessment and treatment plan with the patient. The patient was provided an opportunity to ask questions and all were answered. The patient agreed with the plan and demonstrated an understanding of the instructions.   The patient was advised to call back or seek an in-person evaluation if the symptoms  worsen or if the condition fails to improve as anticipated.   Oliver BarreJames Ido Wollman, MD

## 2019-05-15 NOTE — Patient Instructions (Signed)
Please take all new medication as prescribed - the antibiotic, cough medicine, prednisone, and ambien for sleep  Please continue all other medications as before, and refills have been done if requested - the albuterol inhaler  Please have the pharmacy call with any other refills you may need.  Please continue your efforts at being more active, low cholesterol diet, and weight control.  Please keep your appointments with your specialists as you may have planned

## 2019-05-15 NOTE — Assessment & Plan Note (Signed)
Mild to mod, trazodone not helped in the past, melatonin not helping now, for trial ambien prn ,  to f/u any worsening symptoms or concerns

## 2019-06-30 ENCOUNTER — Telehealth: Payer: Self-pay | Admitting: Internal Medicine

## 2019-06-30 NOTE — Telephone Encounter (Signed)
I received short term disability form from [employer] via fax. Requesting leave starting 06/15/19 with office notes. Patient has not been seen since 05/15/19.   LVM for patient to call back and make an appointment to discuss with provider.

## 2019-07-15 NOTE — Telephone Encounter (Signed)
I have left another VM for patient.   I do not see where you have taken the patient out of work. The form states this is a request for leave pass 06/28/19. You have not seen the patient since 05/15/19. Please advise if I am missing something.  I have requested for the patient to call back and set up an appointment to discuss.

## 2019-07-15 NOTE — Telephone Encounter (Signed)
I think that is true, so I would not be able to fill out ST disability forms if pt was not seen

## 2019-08-10 ENCOUNTER — Ambulatory Visit: Payer: PRIVATE HEALTH INSURANCE | Admitting: Family

## 2019-08-11 ENCOUNTER — Ambulatory Visit: Payer: PRIVATE HEALTH INSURANCE | Admitting: Family

## 2019-08-11 ENCOUNTER — Ambulatory Visit (INDEPENDENT_AMBULATORY_CARE_PROVIDER_SITE_OTHER): Payer: PRIVATE HEALTH INSURANCE | Admitting: Internal Medicine

## 2019-08-11 DIAGNOSIS — I10 Essential (primary) hypertension: Secondary | ICD-10-CM

## 2019-08-11 DIAGNOSIS — K219 Gastro-esophageal reflux disease without esophagitis: Secondary | ICD-10-CM

## 2019-08-11 DIAGNOSIS — K5900 Constipation, unspecified: Secondary | ICD-10-CM

## 2019-08-11 DIAGNOSIS — Z0000 Encounter for general adult medical examination without abnormal findings: Secondary | ICD-10-CM

## 2019-08-11 DIAGNOSIS — G4700 Insomnia, unspecified: Secondary | ICD-10-CM

## 2019-08-11 MED ORDER — ZOLPIDEM TARTRATE 10 MG PO TABS: 10.0000 mg | ORAL_TABLET | Freq: Every evening | ORAL | 1 refills | Status: DC | PRN

## 2019-08-11 MED ORDER — ONDANSETRON HCL 4 MG PO TABS: 4.0000 mg | ORAL_TABLET | Freq: Three times a day (TID) | ORAL | 0 refills | Status: AC | PRN

## 2019-08-11 MED ORDER — LINACLOTIDE 145 MCG PO CAPS: 145.0000 ug | ORAL_CAPSULE | Freq: Every day | ORAL | 5 refills | Status: DC

## 2019-08-11 NOTE — Progress Notes (Signed)
Patient ID: Ellen Fernandez, female   DOB: 04-19-1971, 48 y.o.   MRN: [id number]  Virtual Visit via Video Note  I connected with Ellen Fernandez on 08/11/19 at 10:00 AM EST by a video enabled telemedicine application and verified that I am speaking with the correct person using two identifiers.  Location: Patient: at home Provider: at office   I discussed the limitations of evaluation and management by telemedicine and the availability of in person appointments. The patient expressed understanding and agreed to proceed.  History of Present Illness: Here with 2 wks worsening acute on chronic constipation with abd tenderness, nausea, decreased appetite, and vomit x 1.  No high fever, though did have temp 99.4 one time.  Has tried miralax, dulcolox and senakot.  Nothing seems to make better or worse.  Denies urinary symptoms such as dysuria, frequency, urgency, flank pain, hematuria or n/v, fever, chills.  Due for colonoscopy given new screening guidelines.  Pt denies chest pain, increased sob or doe, wheezing, orthopnea, PND, increased LE swelling, palpitations, dizziness or syncope.  Pt denies new neurological symptoms such as new headache, or facial or extremity weakness or numbness   Pt denies polydipsia, polyuria, Also with increased insomnia the last several months.  Denies worsening depressive symptoms, suicidal ideation, or panic Past Medical History:  Diagnosis Date  . Asthma   . GERD (gastroesophageal reflux disease)   . HTN (hypertension)   . Hyperlipidemia    Past Surgical History:  Procedure Laterality Date  . ABDOMINAL HYSTERECTOMY    . partial small bowel resection  2017   complication of umbilical hernia repair  . TUBAL LIGATION    . UMBILICAL HERNIA REPAIR      reports that she has never smoked. She has never used smokeless tobacco. She reports current alcohol use. She reports that she does not use drugs. family history includes Cancer in her father; Hypertension in her mother;  Rashes / Skin problems in her daughter; Rheum arthritis in her daughter. Allergies  Allergen Reactions  . Sulfonamide Derivatives Hives   Current Outpatient Medications on File Prior to Visit  Medication Sig Dispense Refill  . albuterol (VENTOLIN HFA) 108 (90 Base) MCG/ACT inhaler Inhale 2 puffs into the lungs every 6 (six) hours as needed for wheezing or shortness of breath. 18 g 11  . amLODipine (NORVASC) 10 MG tablet Take 1 tablet (10 mg total) by mouth daily. 90 tablet 3  . azelastine (OPTIVAR) 0.05 % ophthalmic solution Place 1 drop into both eyes 2 (two) times daily. 6 mL 12  . budesonide-formoterol (SYMBICORT) 160-4.5 MCG/ACT inhaler Inhale 2 puffs into the lungs 2 (two) times daily. 10.2 g 11  . hydrochlorothiazide (MICROZIDE) 12.5 MG capsule Take 1 capsule (12.5 mg total) by mouth daily. 90 capsule 3  . hyoscyamine (LEVSIN) 0.125 MG/5ML ELIX Take 0.125 mg by mouth every 4 (four) hours as needed.    Marland Kitchen ibuprofen (ADVIL,MOTRIN) 800 MG tablet Take 1 tablet (800 mg total) by mouth daily as needed for moderate pain. 90 tablet 3  . omeprazole (PRILOSEC) 40 MG capsule Take 1 capsule (40 mg total) by mouth daily. 90 capsule 3  . predniSONE (DELTASONE) 10 MG tablet 3 tabs by mouth per day for 3 days,2tabs per day for 3 days,1tab per day for 3 days 18 tablet 0  . rosuvastatin (CRESTOR) 10 MG tablet Take 1 tablet (10 mg total) by mouth daily. 90 tablet 3  . triamcinolone (NASACORT) 55 MCG/ACT AERO nasal inhaler Place 2 sprays into the  nose daily. 1 Inhaler 11   No current facility-administered medications on file prior to visit.     Observations/Objective: Alert, NAD, appropriate mood and affect, resps normal, cn 2-12 intact, moves all 4s, no visible rash or swelling Lab Results  Component Value Date   WBC 10.0 09/18/2018   HGB 15.1 (H) 09/18/2018   HCT 44.8 09/18/2018   PLT 326.0 09/18/2018   GLUCOSE 91 09/18/2018   CHOL 218 (H) 09/18/2018   TRIG 121.0 09/18/2018   HDL 65.80 09/18/2018    LDLCALC 128 (H) 09/18/2018   ALT 15 09/18/2018   AST 16 09/18/2018   NA 137 09/18/2018   K 3.9 09/18/2018   CL 102 09/18/2018   CREATININE 0.78 09/18/2018   BUN 9 09/18/2018   CO2 26 09/18/2018   TSH 2.72 09/18/2018   Assessment and Plan: See notes  Follow Up Instructions: See notes   I discussed the assessment and treatment plan with the patient. The patient was provided an opportunity to ask questions and all were answered. The patient agreed with the plan and demonstrated an understanding of the instructions.   The patient was advised to call back or seek an in-person evaluation if the symptoms worsen or if the condition fails to improve as anticipated.  Cathlean Cower, MD

## 2019-08-16 ENCOUNTER — Encounter: Payer: Self-pay | Admitting: Internal Medicine

## 2019-08-16 DIAGNOSIS — K5900 Constipation, unspecified: Secondary | ICD-10-CM | POA: Insufficient documentation

## 2019-08-16 NOTE — Assessment & Plan Note (Signed)
Ok for ambien qhs prn,  to f/u any worsening symptoms or concerns  

## 2019-08-16 NOTE — Assessment & Plan Note (Signed)
stable overall by history and exam, recent data reviewed with pt, and pt to continue medical treatment as before,  to f/u any worsening symptoms or concerns  

## 2019-08-16 NOTE — Assessment & Plan Note (Signed)
recent worsening, for linzess prn, zofran prn, refer colonoscopy,  to f/u any worsening symptoms or concerns

## 2019-08-16 NOTE — Patient Instructions (Signed)
Please take all new medication as prescribed  Please continue all other medications as before, and refills have been done if requested.  Please have the pharmacy call with any other refills you may need.  Please continue your efforts at being more active, low cholesterol diet, and weight control.  Please keep your appointments with your specialists as you may have planned    

## 2019-09-23 ENCOUNTER — Encounter: Payer: PRIVATE HEALTH INSURANCE | Admitting: Internal Medicine

## 2019-09-23 DIAGNOSIS — Z0289 Encounter for other administrative examinations: Secondary | ICD-10-CM

## 2019-10-10 ENCOUNTER — Other Ambulatory Visit: Payer: Self-pay | Admitting: Internal Medicine

## 2019-11-14 ENCOUNTER — Other Ambulatory Visit: Payer: Self-pay | Admitting: Internal Medicine

## 2019-11-14 NOTE — Telephone Encounter (Signed)
Please refill as per office routine med refill policy (all routine meds refilled for 3 mo or monthly per pt preference up to one year from last visit, then month to month grace period for 3 mo, then further med refills will have to be denied)  

## 2019-11-16 ENCOUNTER — Other Ambulatory Visit: Payer: Self-pay | Admitting: Internal Medicine

## 2019-12-14 ENCOUNTER — Telehealth: Payer: Self-pay

## 2019-12-14 NOTE — Telephone Encounter (Signed)
Fx from [facility] refill for Amlodipine 10mg  tablet.  Last visit was virtual 08/15/2019. Please advise if refill is appropriate.   No f/u visit has been scheduled.

## 2019-12-15 MED ORDER — AMLODIPINE BESYLATE 10 MG PO TABS: 10.0000 mg | ORAL_TABLET | Freq: Every day | ORAL | 1 refills | Status: DC

## 2019-12-15 NOTE — Telephone Encounter (Signed)
Done erx 

## 2020-01-12 ENCOUNTER — Encounter: Payer: Self-pay | Admitting: Internal Medicine

## 2020-01-12 ENCOUNTER — Telehealth: Payer: PRIVATE HEALTH INSURANCE | Admitting: Internal Medicine

## 2020-01-12 NOTE — Progress Notes (Signed)
Patient ID: Ellen Fernandez, female   DOB: 18-Apr-1971, 49 y.o.   MRN: [id number]  Virtual Visit via Video Note  Pt no show for visit by video and multiple phone attempts  Oliver Barre MD

## 2020-01-12 NOTE — Patient Instructions (Signed)
none

## 2020-01-14 ENCOUNTER — Telehealth (INDEPENDENT_AMBULATORY_CARE_PROVIDER_SITE_OTHER): Payer: 59 | Admitting: Internal Medicine

## 2020-01-14 ENCOUNTER — Encounter: Payer: Self-pay | Admitting: Internal Medicine

## 2020-01-14 DIAGNOSIS — J309 Allergic rhinitis, unspecified: Secondary | ICD-10-CM

## 2020-01-14 DIAGNOSIS — J019 Acute sinusitis, unspecified: Secondary | ICD-10-CM | POA: Diagnosis not present

## 2020-01-14 DIAGNOSIS — J45909 Unspecified asthma, uncomplicated: Secondary | ICD-10-CM

## 2020-01-14 MED ORDER — HYDROCODONE-HOMATROPINE 5-1.5 MG/5ML PO SYRP: 5.0000 mL | ORAL_SOLUTION | Freq: Four times a day (QID) | ORAL | 0 refills | Status: AC | PRN

## 2020-01-14 MED ORDER — PREDNISONE 10 MG PO TABS: ORAL_TABLET | ORAL | 0 refills | Status: DC

## 2020-01-14 MED ORDER — MUCINEX 600 MG PO TB12: 1200.0000 mg | ORAL_TABLET | Freq: Two times a day (BID) | ORAL | 0 refills | Status: DC | PRN

## 2020-01-14 MED ORDER — DOXYCYCLINE HYCLATE 100 MG PO TABS: 100.0000 mg | ORAL_TABLET | Freq: Two times a day (BID) | ORAL | 0 refills | Status: DC

## 2020-01-14 NOTE — Assessment & Plan Note (Signed)
Mild to mod, for antibx course,  to f/u any worsening symptoms or concerns 

## 2020-01-14 NOTE — Assessment & Plan Note (Signed)
stable overall by history and exam, recent data reviewed with pt, and pt to continue medical treatment as before,  to f/u any worsening symptoms or concerns  

## 2020-01-14 NOTE — Progress Notes (Signed)
Patient ID: Ellen Fernandez, female   DOB: [birth year], 49 y.o.   MRN: [id number]  Virtual Visit via Video Note  I connected with Ellen Fernandez on 01/14/20 at  9:00 AM EDT by a video enabled telemedicine application and verified that I am speaking with the correct person using two identifiers.  Location: Patient: at home Provider: at office   I discussed the limitations of evaluation and management by telemedicine and the availability of in person appointments. The patient expressed understanding and agreed to proceed.  History of Present Illness:  Here with 2-3 days acute onset fever, facial pain, pressure, headache, general weakness and malaise, and greenish d/c, with mod ST and cough, but pt denies chest pain, wheezing, increased sob or doe, orthopnea, PND, increased LE swelling, palpitations, dizziness or syncope. Does have several wks ongoing nasal allergy symptoms with clearish congestion, itch and sneezing, without fever, pain, ST, swelling or wheezing.  Past Medical History:  Diagnosis Date  . Asthma   . GERD (gastroesophageal reflux disease)   . HTN (hypertension)   . Hyperlipidemia    Past Surgical History:  Procedure Laterality Date  . ABDOMINAL HYSTERECTOMY    . partial small bowel resection  0865   complication of umbilical hernia repair  . TUBAL LIGATION    . UMBILICAL HERNIA REPAIR      reports that she has never smoked. She has never used smokeless tobacco. She reports current alcohol use. She reports that she does not use drugs. family history includes Cancer in her father; Hypertension in her mother; Rashes / Skin problems in her daughter; Rheum arthritis in her daughter. Allergies  Allergen Reactions  . Sulfonamide Derivatives Hives   Current Outpatient Medications on File Prior to Visit  Medication Sig Dispense Refill  . albuterol (VENTOLIN HFA) 108 (90 Base) MCG/ACT inhaler Inhale 2 puffs into the lungs every 6 (six) hours as needed for wheezing or shortness of  breath. 18 g 11  . amLODipine (NORVASC) 10 MG tablet Take 1 tablet (10 mg total) by mouth daily. Overdue for Annual appt w/labs must see provider for future refills 90 tablet 1  . azelastine (OPTIVAR) 0.05 % ophthalmic solution Place 1 drop into both eyes 2 (two) times daily. 6 mL 12  . budesonide-formoterol (SYMBICORT) 160-4.5 MCG/ACT inhaler Inhale 2 puffs into the lungs 2 (two) times daily. 10.2 g 11  . hydrochlorothiazide (MICROZIDE) 12.5 MG capsule TAKE 1 CAPSULE(12.5 MG) BY MOUTH DAILY 90 capsule 2  . hyoscyamine (LEVSIN) 0.125 MG/5ML ELIX Take 0.125 mg by mouth every 4 (four) hours as needed.    Marland Kitchen ibuprofen (ADVIL,MOTRIN) 800 MG tablet Take 1 tablet (800 mg total) by mouth daily as needed for moderate pain. 90 tablet 3  . linaclotide (LINZESS) 145 MCG CAPS capsule Take 1 capsule (145 mcg total) by mouth daily before breakfast. 30 capsule 5  . meloxicam (MOBIC) 15 MG tablet TAKE 1 TABLET(15 MG) BY MOUTH DAILY 90 tablet 2  . omeprazole (PRILOSEC) 40 MG capsule TAKE 1 CAPSULE(40 MG) BY MOUTH DAILY 90 capsule 2  . ondansetron (ZOFRAN) 4 MG tablet Take 1 tablet (4 mg total) by mouth every 8 (eight) hours as needed for nausea or vomiting. 40 tablet 0  . rosuvastatin (CRESTOR) 10 MG tablet TAKE 1 TABLET(10 MG) BY MOUTH DAILY 90 tablet 2  . triamcinolone (NASACORT) 55 MCG/ACT AERO nasal inhaler Place 2 sprays into the nose daily. 1 Inhaler 11  . zolpidem (AMBIEN) 10 MG tablet Take 1 tablet (10 mg total) by  mouth at bedtime as needed for sleep. 90 tablet 1   No current facility-administered medications on file prior to visit.    Observations/Objective: The patient will observe these symptoms, and report promptly any worsening or unexpected persistence.  If well, may return prn. Lab Results  Component Value Date   WBC 10.0 09/18/2018   HGB 15.1 (H) 09/18/2018   HCT 44.8 09/18/2018   PLT 326.0 09/18/2018   GLUCOSE 91 09/18/2018   CHOL 218 (H) 09/18/2018   TRIG 121.0 09/18/2018   HDL 65.80  09/18/2018   LDLCALC 128 (H) 09/18/2018   ALT 15 09/18/2018   AST 16 09/18/2018   NA 137 09/18/2018   K 3.9 09/18/2018   CL 102 09/18/2018   CREATININE 0.78 09/18/2018   BUN 9 09/18/2018   CO2 26 09/18/2018   TSH 2.72 09/18/2018   Assessment and Plan: See notes  Follow Up Instructions: See notes   I discussed the assessment and treatment plan with the patient. The patient was provided an opportunity to ask questions and all were answered. The patient agreed with the plan and demonstrated an understanding of the instructions.   The patient was advised to call back or seek an in-person evaluation if the symptoms worsen or if the condition fails to improve as anticipated.   Oliver Barre, MD

## 2020-01-14 NOTE — Patient Instructions (Signed)
Please take all new medication as prescribed 

## 2020-01-14 NOTE — Assessment & Plan Note (Signed)
Mild to mod, for cough med prn, predpac asd, mucinex bid prn, and cont nasacort,  to f/u any worsening symptoms or concerns

## 2020-01-28 ENCOUNTER — Other Ambulatory Visit: Payer: Self-pay

## 2020-01-28 ENCOUNTER — Other Ambulatory Visit: Payer: Self-pay | Admitting: Internal Medicine

## 2020-01-28 ENCOUNTER — Ambulatory Visit (INDEPENDENT_AMBULATORY_CARE_PROVIDER_SITE_OTHER): Payer: Managed Care, Other (non HMO) | Admitting: Internal Medicine

## 2020-01-28 ENCOUNTER — Encounter: Payer: Self-pay | Admitting: Internal Medicine

## 2020-01-28 VITALS — BP 130/80 | HR 89 | Temp 98.3°F | Ht 62.0 in | Wt 193.0 lb

## 2020-01-28 DIAGNOSIS — E559 Vitamin D deficiency, unspecified: Secondary | ICD-10-CM

## 2020-01-28 DIAGNOSIS — I10 Essential (primary) hypertension: Secondary | ICD-10-CM

## 2020-01-28 DIAGNOSIS — E538 Deficiency of other specified B group vitamins: Secondary | ICD-10-CM

## 2020-01-28 DIAGNOSIS — Z0000 Encounter for general adult medical examination without abnormal findings: Secondary | ICD-10-CM

## 2020-01-28 LAB — LIPID PANEL
Cholesterol: 286 mg/dL — ABNORMAL HIGH (ref 0–200)
HDL: 70.8 mg/dL (ref >39.00)
LDL Cholesterol: 193 mg/dL — ABNORMAL HIGH (ref 0–99)
NonHDL: 215.66
Total CHOL/HDL Ratio: 4
Triglycerides: 112 mg/dL (ref 0.0–149.0)
VLDL: 22.4 mg/dL (ref 0.0–40.0)

## 2020-01-28 LAB — BASIC METABOLIC PANEL
BUN: 10 mg/dL (ref 6–23)
CO2: 34 mEq/L — ABNORMAL HIGH (ref 19–32)
Calcium: 9.8 mg/dL (ref 8.4–10.5)
Chloride: 101 mEq/L (ref 96–112)
Creatinine, Ser: 0.86 mg/dL (ref 0.40–1.20)
GFR: 84.6 mL/min (ref >60.00)
Glucose, Bld: 100 mg/dL — ABNORMAL HIGH (ref 70–99)
Potassium: 3.5 mEq/L (ref 3.5–5.1)
Sodium: 139 mEq/L (ref 135–145)

## 2020-01-28 LAB — URINALYSIS, ROUTINE W REFLEX MICROSCOPIC
Bilirubin Urine: NEGATIVE
Hgb urine dipstick: NEGATIVE
Ketones, ur: NEGATIVE
Leukocytes,Ua: NEGATIVE
Nitrite: NEGATIVE
RBC / HPF: NONE SEEN (ref 0–?)
Specific Gravity, Urine: 1.015 (ref 1.000–1.030)
Total Protein, Urine: NEGATIVE
Urine Glucose: NEGATIVE
Urobilinogen, UA: 0.2 (ref 0.0–1.0)
pH: 8.5 — AB (ref 5.0–8.0)

## 2020-01-28 LAB — CBC WITH DIFFERENTIAL/PLATELET
Basophils Absolute: 0.1 10*3/uL (ref 0.0–0.1)
Basophils Relative: 0.7 % (ref 0.0–3.0)
Eosinophils Absolute: 0.3 10*3/uL (ref 0.0–0.7)
Eosinophils Relative: 3 % (ref 0.0–5.0)
HCT: 43 % (ref 36.0–46.0)
Hemoglobin: 14.6 g/dL (ref 12.0–15.0)
Lymphocytes Relative: 28.8 % (ref 12.0–46.0)
Lymphs Abs: 2.7 10*3/uL (ref 0.7–4.0)
MCHC: 33.9 g/dL (ref 30.0–36.0)
MCV: 91.6 fl (ref 78.0–100.0)
Monocytes Absolute: 0.7 10*3/uL (ref 0.1–1.0)
Monocytes Relative: 7.9 % (ref 3.0–12.0)
Neutro Abs: 5.5 10*3/uL (ref 1.4–7.7)
Neutrophils Relative %: 59.6 % (ref 43.0–77.0)
Platelets: 285 10*3/uL (ref 150.0–400.0)
RBC: 4.7 Mil/uL (ref 3.87–5.11)
RDW: 14.4 % (ref 11.5–15.5)
WBC: 9.2 10*3/uL (ref 4.0–10.5)

## 2020-01-28 LAB — HEPATIC FUNCTION PANEL
ALT: 19 U/L (ref 0–35)
AST: 19 U/L (ref 0–37)
Albumin: 4.3 g/dL (ref 3.5–5.2)
Alkaline Phosphatase: 81 U/L (ref 39–117)
Bilirubin, Direct: 0 mg/dL (ref 0.0–0.3)
Total Bilirubin: 0.4 mg/dL (ref 0.2–1.2)
Total Protein: 7.7 g/dL (ref 6.0–8.3)

## 2020-01-28 LAB — VITAMIN B12: Vitamin B-12: 386 pg/mL (ref 211–911)

## 2020-01-28 LAB — TSH: TSH: 1.87 u[IU]/mL (ref 0.35–4.50)

## 2020-01-28 LAB — VITAMIN D 25 HYDROXY (VIT D DEFICIENCY, FRACTURES): VITD: 13.76 ng/mL — ABNORMAL LOW (ref 30.00–100.00)

## 2020-01-28 MED ORDER — ROSUVASTATIN CALCIUM 20 MG PO TABS
20.0000 mg | ORAL_TABLET | Freq: Every day | ORAL | 3 refills | Status: DC
Start: 2020-01-28 — End: 2021-02-21

## 2020-01-28 NOTE — Progress Notes (Signed)
Subjective:    Patient ID: Ellen Fernandez, female    DOB: 20-Dec-1970, 49 y.o.   MRN: [id number]  HPI  Here for wellness and f/u;  Overall doing ok;  Pt denies Chest pain, worsening SOB, DOE, wheezing, orthopnea, PND, worsening LE edema, palpitations, dizziness or syncope.  Pt denies neurological change such as new headache, facial or extremity weakness.  Pt denies polydipsia, polyuria, or low sugar symptoms. Pt states overall good compliance with treatment and medications, good tolerability, and has been trying to follow appropriate diet.  Pt denies worsening depressive symptoms, suicidal ideation or panic. No fever, night sweats, wt loss, loss of appetite, or other constitutional symptoms.  Pt states good ability with ADL's, has low fall risk, home safety reviewed and adequate, no other significant changes in hearing or vision, and only occasionally active with exercise.  No new complaints Past Medical History:  Diagnosis Date  . Asthma   . GERD (gastroesophageal reflux disease)   . HTN (hypertension)   . Hyperlipidemia    Past Surgical History:  Procedure Laterality Date  . ABDOMINAL HYSTERECTOMY    . partial small bowel resection  6222   complication of umbilical hernia repair  . TUBAL LIGATION    . UMBILICAL HERNIA REPAIR      reports that she has never smoked. She has never used smokeless tobacco. She reports current alcohol use. She reports that she does not use drugs. family history includes Cancer in her father; Hypertension in her mother; Rashes / Skin problems in her daughter; Rheum arthritis in her daughter. Allergies  Allergen Reactions  . Sulfonamide Derivatives Hives   Current Outpatient Medications on File Prior to Visit  Medication Sig Dispense Refill  . albuterol (VENTOLIN HFA) 108 (90 Base) MCG/ACT inhaler Inhale 2 puffs into the lungs every 6 (six) hours as needed for wheezing or shortness of breath. 18 g 11  . amLODipine (NORVASC) 10 MG tablet Take 1 tablet (10 mg  total) by mouth daily. Overdue for Annual appt w/labs must see provider for future refills 90 tablet 1  . azelastine (OPTIVAR) 0.05 % ophthalmic solution Place 1 drop into both eyes 2 (two) times daily. 6 mL 12  . budesonide-formoterol (SYMBICORT) 160-4.5 MCG/ACT inhaler Inhale 2 puffs into the lungs 2 (two) times daily. 10.2 g 11  . guaiFENesin (MUCINEX) 600 MG 12 hr tablet Take 2 tablets (1,200 mg total) by mouth 2 (two) times daily as needed. 60 tablet 0  . hydrochlorothiazide (MICROZIDE) 12.5 MG capsule TAKE 1 CAPSULE(12.5 MG) BY MOUTH DAILY 90 capsule 2  . hyoscyamine (LEVSIN) 0.125 MG/5ML ELIX Take 0.125 mg by mouth every 4 (four) hours as needed.    Marland Kitchen ibuprofen (ADVIL,MOTRIN) 800 MG tablet Take 1 tablet (800 mg total) by mouth daily as needed for moderate pain. 90 tablet 3  . linaclotide (LINZESS) 145 MCG CAPS capsule Take 1 capsule (145 mcg total) by mouth daily before breakfast. 30 capsule 5  . meloxicam (MOBIC) 15 MG tablet TAKE 1 TABLET(15 MG) BY MOUTH DAILY 90 tablet 2  . omeprazole (PRILOSEC) 40 MG capsule TAKE 1 CAPSULE(40 MG) BY MOUTH DAILY 90 capsule 2  . ondansetron (ZOFRAN) 4 MG tablet Take 1 tablet (4 mg total) by mouth every 8 (eight) hours as needed for nausea or vomiting. 40 tablet 0  . predniSONE (DELTASONE) 10 MG tablet 3 tabs by mouth per day for 3 days,2tabs per day for 3 days,1tab per day for 3 days 18 tablet 0  . triamcinolone (NASACORT)  55 MCG/ACT AERO nasal inhaler Place 2 sprays into the nose daily. 1 Inhaler 11  . zolpidem (AMBIEN) 10 MG tablet Take 1 tablet (10 mg total) by mouth at bedtime as needed for sleep. 90 tablet 1   No current facility-administered medications on file prior to visit.   Review of Systems All otherwise neg per pt    Objective:   Physical Exam BP 130/80 (BP Location: Left Arm, Patient Position: Sitting, Cuff Size: Large)   Pulse 89   Temp 98.3 F (36.8 C) (Oral)   Ht 5\' 2"  (1.575 m)   Wt 193 lb (87.5 kg)   SpO2 96%   BMI 35.30 kg/m   VS noted,  Constitutional: Pt appears in NAD HENT: Head: NCAT.  Right Ear: External ear normal.  Left Ear: External ear normal.  Eyes: . Pupils are equal, round, and reactive to light. Conjunctivae and EOM are normal Nose: without d/c or deformity Neck: Neck supple. Gross normal ROM Cardiovascular: Normal rate and regular rhythm.   Pulmonary/Chest: Effort normal and breath sounds without rales or wheezing.  Abd:  Soft, NT, ND, + BS, no organomegaly Neurological: Pt is alert. At baseline orientation, motor grossly intact Skin: Skin is warm. No rashes, other new lesions, no LE edema Psychiatric: Pt behavior is normal without agitation  All otherwise neg per pt Lab Results  Component Value Date   WBC 9.2 01/28/2020   HGB 14.6 01/28/2020   HCT 43.0 01/28/2020   PLT 285.0 01/28/2020   GLUCOSE 100 (H) 01/28/2020   CHOL 286 (H) 01/28/2020   TRIG 112.0 01/28/2020   HDL 70.80 01/28/2020   LDLCALC 193 (H) 01/28/2020   ALT 19 01/28/2020   AST 19 01/28/2020   NA 139 01/28/2020   K 3.5 01/28/2020   CL 101 01/28/2020   CREATININE 0.86 01/28/2020   BUN 10 01/28/2020   CO2 34 (H) 01/28/2020   TSH 1.87 01/28/2020      Assessment & Plan:

## 2020-01-28 NOTE — Patient Instructions (Addendum)
Please continue all other medications as before, and refills have been done if requested.  Please have the pharmacy call with any other refills you may need.  Please continue your efforts at being more active, low cholesterol diet, and weight control.  You are otherwise up to date with prevention measures today.  Please keep your appointments with your specialists as you may have planned  You will be contacted regarding the referral for: colonoscopy  Please go to the LAB at the blood drawing area for the tests to be done  You will be contacted by phone if any changes need to be made immediately.  Otherwise, you will receive a letter about your results with an explanation, but please check with MyChart first.  Please remember to sign up for MyChart if you have not done so, as this will be important to you in the future with finding out test results, communicating by private email, and scheduling acute appointments online when needed.  Please make an Appointment to return for your 1 year visit, or sooner if needed, with Lab testing by Appointment as well, to be done about 3-5 days before at the FIRST FLOOR Lab (so this is for TWO appointments - please see the scheduling desk as you leave)    

## 2020-01-30 ENCOUNTER — Encounter: Payer: Self-pay | Admitting: Internal Medicine

## 2020-01-30 NOTE — Assessment & Plan Note (Signed)

## 2020-01-30 NOTE — Assessment & Plan Note (Signed)
stable overall by history and exam, recent data reviewed with pt, and pt to continue medical treatment as before,  to f/u any worsening symptoms or concerns  

## 2020-02-02 ENCOUNTER — Encounter: Payer: Self-pay | Admitting: Gastroenterology

## 2020-02-15 ENCOUNTER — Other Ambulatory Visit: Payer: Self-pay | Admitting: Internal Medicine

## 2020-02-16 NOTE — Telephone Encounter (Signed)
Done erx 

## 2020-03-08 ENCOUNTER — Telehealth: Payer: Self-pay

## 2020-03-08 NOTE — Telephone Encounter (Signed)
NS office visit.  Requested a call back by 5:00 to prevent cancellation.  No return call back.  All appts cancelled until pt calls to reschedule.

## 2020-03-08 NOTE — Telephone Encounter (Signed)
NS to [facility].  Attempted to reach pt with message left.  Requested a call back by 5:00 to avoid having procedures cancelled.

## 2020-03-22 ENCOUNTER — Encounter: Payer: 59 | Admitting: Gastroenterology

## 2020-06-08 ENCOUNTER — Other Ambulatory Visit: Payer: Self-pay | Admitting: Internal Medicine

## 2020-06-08 NOTE — Telephone Encounter (Signed)
Please refill as per office routine med refill policy (all routine meds refilled for 3 mo or monthly per pt preference up to one year from last visit, then month to month grace period for 3 mo, then further med refills will have to be denied)  

## 2020-08-12 ENCOUNTER — Other Ambulatory Visit: Payer: Self-pay | Admitting: Internal Medicine

## 2020-09-02 ENCOUNTER — Telehealth: Payer: Managed Care, Other (non HMO) | Admitting: Nurse Practitioner

## 2020-09-02 DIAGNOSIS — Z20822 Contact with and (suspected) exposure to covid-19: Secondary | ICD-10-CM

## 2020-09-02 NOTE — Progress Notes (Signed)
E-Visit for Corona Virus Screening  Your current symptoms could be consistent with the coronavirus.  Many health care providers can now test patients at their office but not all are.  [facility] has multiple testing sites. For information on our COVID testing locations and hours go to [email or web address]  You must be tested for covid before any further t.reatment can be rendered  Testing Information: The COVID-19 Community Testing sites are testing BY APPOINTMENT ONLY.  You can schedule online at [email or web address]  If you do not have access to a smart phone or computer you may call [phone] for an appointment.   Additional testing sites in the Community:  . For [facility] Testing sites in [facility]  [email or web address]  . For Pop-up testing sites in [place]  [email or web address]  . For [email or web address]  . For [facility] testing in [place] and [email or web address]  . For [facility] testing in [facility]   [email or web address]  For  more information about community testing call [phone]   Please quarantine yourself while awaiting your test results. Please stay home for a minimum of 10 days from the first day of illness with improving symptoms and you have had 24 hours of no fever (without the use of Tylenol (Acetaminophen) Motrin (Ibuprofen) or any fever reducing medication).  Also - Do not get tested prior to returning to work because once you have had a positive test the test can stay positive for more than a month in some cases.   You should wear a mask or cloth face covering over your nose and mouth if you  must be around other people or animals, including pets (even at home). Try to stay at least 6 feet away from other people. This will protect the people around you.  Please continue good preventive care measures, including:  frequent hand-washing, avoid touching your face, cover coughs/sneezes, stay out of crowds and keep a 6 foot distance from others.  COVID-19 is a respiratory illness with symptoms that are similar to the flu. Symptoms are typically mild to moderate, but there have been cases of severe illness and death due to the virus.   The following symptoms may appear 2-14 days after exposure: . Fever . Cough . Shortness of breath or difficulty breathing . Chills . Repeated shaking with chills . Muscle pain . Headache . Sore throat . New loss of taste or smell . Fatigue . Congestion or runny nose . Nausea or vomiting . Diarrhea  Go to the nearest hospital ED for assessment if fever/cough/breathlessness are severe or illness seems like a threat to life.  It is vitally important that if you feel that you have an infection such as this virus or any other virus that you stay home and away from places where you may spread it to others.  You should avoid contact with people age 55 and older.    You may also take acetaminophen (Tylenol) as needed for fever.  Reduce your risk of any infection by using the same precautions used for avoiding the common cold or flu:  Marland Kitchen Wash your hands often with soap and warm water for at least 20 seconds.  If soap and water are not readily available, use an alcohol-based hand sanitizer with at least 60% alcohol.  . If coughing or sneezing, cover your mouth and nose by coughing or sneezing into the elbow areas of your shirt or coat, into a tissue or into your sleeve (not your hands). Marland Kitchen  Avoid shaking hands with others and consider head nods or verbal greetings only. . Avoid touching your eyes, nose, or mouth with unwashed hands.  . Avoid close contact with  people who are sick. . Avoid places or events with large numbers of people in one location, like concerts or sporting events. . Carefully consider travel plans you have or are making. . If you are planning any travel outside or inside the [place], visit the CDC's Travelers' Health webpage for the latest health notices. . If you have some symptoms but not all symptoms, continue to monitor at home and seek medical attention if your symptoms worsen. . If you are having a medical emergency, call 911.  HOME CARE . Only take medications as instructed by your medical team. . Drink plenty of fluids and get plenty of rest. . A steam or ultrasonic humidifier can help if you have congestion.   GET HELP RIGHT AWAY IF YOU HAVE EMERGENCY WARNING SIGNS** FOR COVID-19. If you or someone is showing any of these signs seek emergency medical care immediately. Call 911 or proceed to your closest emergency facility if: . You develop worsening high fever. . Trouble breathing . Bluish lips or face . Persistent pain or pressure in the chest . New confusion . Inability to wake or stay awake . You cough up blood. . Your symptoms become more severe  **This list is not all possible symptoms. Contact your medical provider for any symptoms that are sever or concerning to you.  MAKE SURE YOU   Understand these instructions.  Will watch your condition.  Will get help right away if you are not doing well or get worse.  Your e-visit answers were reviewed by a board certified advanced clinical practitioner to complete your personal care plan.  Depending on the condition, your plan could have included both over the counter or prescription medications.  If there is a problem please reply once you have received a response from your provider.  Your safety is important to [place].  If you have drug allergies check your prescription carefully.    You can use MyChart to ask questions about today's visit, request a non-urgent call  back, or ask for a work or school excuse for 24 hours related to this e-Visit. If it has been greater than 24 hours you will need to follow up with your provider, or enter a new e-Visit to address those concerns. You will get an e-mail in the next two days asking about your experience.  I hope that your e-visit has been valuable and will speed your recovery. Thank you for using e-visits.  5-10 minutes spent reviewing and documenting in chart.

## 2020-09-13 ENCOUNTER — Telehealth (INDEPENDENT_AMBULATORY_CARE_PROVIDER_SITE_OTHER): Payer: Managed Care, Other (non HMO) | Admitting: Internal Medicine

## 2020-09-13 ENCOUNTER — Other Ambulatory Visit: Payer: Self-pay

## 2020-09-13 ENCOUNTER — Encounter: Payer: Self-pay | Admitting: Internal Medicine

## 2020-09-13 DIAGNOSIS — U071 COVID-19: Secondary | ICD-10-CM | POA: Diagnosis not present

## 2020-09-13 DIAGNOSIS — J45909 Unspecified asthma, uncomplicated: Secondary | ICD-10-CM | POA: Diagnosis not present

## 2020-09-13 MED ORDER — DEXAMETHASONE 6 MG PO TABS
6.0000 mg | ORAL_TABLET | Freq: Every day | ORAL | 0 refills | Status: DC
Start: 2020-09-13 — End: 2021-02-21

## 2020-09-13 MED ORDER — HYDROCODONE-HOMATROPINE 5-1.5 MG/5ML PO SYRP: 5.0000 mL | ORAL_SOLUTION | Freq: Four times a day (QID) | ORAL | 0 refills | Status: DC | PRN

## 2020-09-13 MED ORDER — HYDROCODONE-HOMATROPINE 5-1.5 MG/5ML PO SYRP: 5.0000 mL | ORAL_SOLUTION | Freq: Four times a day (QID) | ORAL | 0 refills | Status: AC | PRN

## 2020-09-13 NOTE — Assessment & Plan Note (Signed)
Denies wheezing, to continue current inhaler use prn Current Outpatient Medications on File Prior to Visit  Medication Sig Dispense Refill  . albuterol (VENTOLIN HFA) 108 (90 Base) MCG/ACT inhaler Inhale 2 puffs into the lungs every 6 (six) hours as needed for wheezing or shortness of breath. 18 g 11  . amLODipine (NORVASC) 10 MG tablet TAKE 1 TABLET BY MOUTH EVERY DAY. MUST BE SEEN FOR LABS BEFORE FURTHER REFILLS 90 tablet 1  . azelastine (OPTIVAR) 0.05 % ophthalmic solution Place 1 drop into both eyes 2 (two) times daily. 6 mL 12  . budesonide-formoterol (SYMBICORT) 160-4.5 MCG/ACT inhaler Inhale 2 puffs into the lungs 2 (two) times daily. 10.2 g 11  . guaiFENesin (MUCINEX) 600 MG 12 hr tablet Take 2 tablets (1,200 mg total) by mouth 2 (two) times daily as needed. 60 tablet 0  . hydrochlorothiazide (MICROZIDE) 12.5 MG capsule TAKE 1 CAPSULE(12.5 MG) BY MOUTH DAILY 90 capsule 2  . hyoscyamine (LEVSIN) 0.125 MG/5ML ELIX Take 0.125 mg by mouth every 4 (four) hours as needed.    Marland Kitchen ibuprofen (ADVIL,MOTRIN) 800 MG tablet Take 1 tablet (800 mg total) by mouth daily as needed for moderate pain. 90 tablet 3  . linaclotide (LINZESS) 145 MCG CAPS capsule Take 1 capsule (145 mcg total) by mouth daily before breakfast. 30 capsule 5  . meloxicam (MOBIC) 15 MG tablet TAKE 1 TABLET(15 MG) BY MOUTH DAILY 90 tablet 2  . omeprazole (PRILOSEC) 40 MG capsule TAKE 1 CAPSULE(40 MG) BY MOUTH DAILY 90 capsule 2  . ondansetron (ZOFRAN) 4 MG tablet Take 1 tablet (4 mg total) by mouth every 8 (eight) hours as needed for nausea or vomiting. 40 tablet 0  . rosuvastatin (CRESTOR) 20 MG tablet Take 1 tablet (20 mg total) by mouth daily. 90 tablet 3  . triamcinolone (NASACORT) 55 MCG/ACT AERO nasal inhaler Place 2 sprays into the nose daily. 1 Inhaler 11  . zolpidem (AMBIEN) 10 MG tablet TAKE 1 TABLET(10 MG) BY MOUTH AT BEDTIME AS NEEDED FOR SLEEP 90 tablet 1   No current facility-administered medications on file prior to  visit.

## 2020-09-13 NOTE — Progress Notes (Signed)
Patient ID: Ellen Fernandez, female   DOB: 11-07-1970, 50 y.o.   MRN: [id number]  Virtual Visit via Video Note  I connected with Ellen Fernandez on 09/13/20 at  3:40 PM EST by a video enabled telemedicine application and verified that I am speaking with the correct person using two identifiers.  Location of all participants today Patient: at home Provider: at office   I discussed the limitations of evaluation and management by telemedicine and the availability of in person appointments. The patient expressed understanding and agreed to proceed.  History of Present Illness: Here with URI symptoms starting dec 21, then known covid + dec 28, seen at UC but o2 ok, now has only non prod cough and sob persisting but not really worsening as the HA, loss of smell, fever, chills, n/v and diarrhea have resolved.  Pt unvaccinated,  Declines to go to ED or UC tonight.  Does not have oximetry at home. Pt denies chest pain, wheezing, orthopnea, PND, increased LE swelling, palpitations, dizziness or syncope.  Pt denies polydipsia, polyuria.   Past Medical History:  Diagnosis Date  . Asthma   . GERD (gastroesophageal reflux disease)   . HTN (hypertension)   . Hyperlipidemia    Past Surgical History:  Procedure Laterality Date  . ABDOMINAL HYSTERECTOMY    . partial small bowel resection  2017   complication of umbilical hernia repair  . TUBAL LIGATION    . UMBILICAL HERNIA REPAIR      reports that she has never smoked. She has never used smokeless tobacco. She reports current alcohol use. She reports that she does not use drugs. family history includes Cancer in her father; Hypertension in her mother; Rashes / Skin problems in her daughter; Rheum arthritis in her daughter. Allergies  Allergen Reactions  . Sulfonamide Derivatives Hives   Current Outpatient Medications on File Prior to Visit  Medication Sig Dispense Refill  . albuterol (VENTOLIN HFA) 108 (90 Base) MCG/ACT inhaler Inhale 2 puffs into the  lungs every 6 (six) hours as needed for wheezing or shortness of breath. 18 g 11  . amLODipine (NORVASC) 10 MG tablet TAKE 1 TABLET BY MOUTH EVERY DAY. MUST BE SEEN FOR LABS BEFORE FURTHER REFILLS 90 tablet 1  . azelastine (OPTIVAR) 0.05 % ophthalmic solution Place 1 drop into both eyes 2 (two) times daily. 6 mL 12  . budesonide-formoterol (SYMBICORT) 160-4.5 MCG/ACT inhaler Inhale 2 puffs into the lungs 2 (two) times daily. 10.2 g 11  . guaiFENesin (MUCINEX) 600 MG 12 hr tablet Take 2 tablets (1,200 mg total) by mouth 2 (two) times daily as needed. 60 tablet 0  . hydrochlorothiazide (MICROZIDE) 12.5 MG capsule TAKE 1 CAPSULE(12.5 MG) BY MOUTH DAILY 90 capsule 2  . hyoscyamine (LEVSIN) 0.125 MG/5ML ELIX Take 0.125 mg by mouth every 4 (four) hours as needed.    Marland Kitchen ibuprofen (ADVIL,MOTRIN) 800 MG tablet Take 1 tablet (800 mg total) by mouth daily as needed for moderate pain. 90 tablet 3  . linaclotide (LINZESS) 145 MCG CAPS capsule Take 1 capsule (145 mcg total) by mouth daily before breakfast. 30 capsule 5  . meloxicam (MOBIC) 15 MG tablet TAKE 1 TABLET(15 MG) BY MOUTH DAILY 90 tablet 2  . omeprazole (PRILOSEC) 40 MG capsule TAKE 1 CAPSULE(40 MG) BY MOUTH DAILY 90 capsule 2  . ondansetron (ZOFRAN) 4 MG tablet Take 1 tablet (4 mg total) by mouth every 8 (eight) hours as needed for nausea or vomiting. 40 tablet 0  . rosuvastatin (CRESTOR) 20  MG tablet Take 1 tablet (20 mg total) by mouth daily. 90 tablet 3  . triamcinolone (NASACORT) 55 MCG/ACT AERO nasal inhaler Place 2 sprays into the nose daily. 1 Inhaler 11  . zolpidem (AMBIEN) 10 MG tablet TAKE 1 TABLET(10 MG) BY MOUTH AT BEDTIME AS NEEDED FOR SLEEP 90 tablet 1   No current facility-administered medications on file prior to visit.    Observations/Objective: Alert, NAD, appropriate mood and affect, resps normal, cn 2-12 intact, moves all 4s, no visible rash or swelling Lab Results  Component Value Date   WBC 9.2 01/28/2020   HGB 14.6  01/28/2020   HCT 43.0 01/28/2020   PLT 285.0 01/28/2020   GLUCOSE 100 (H) 01/28/2020   CHOL 286 (H) 01/28/2020   TRIG 112.0 01/28/2020   HDL 70.80 01/28/2020   LDLCALC 193 (H) 01/28/2020   ALT 19 01/28/2020   AST 19 01/28/2020   NA 139 01/28/2020   K 3.5 01/28/2020   CL 101 01/28/2020   CREATININE 0.86 01/28/2020   BUN 10 01/28/2020   CO2 34 (H) 01/28/2020   TSH 1.87 01/28/2020   Assessment and Plan: See notes  Follow Up Instructions: See notes   I discussed the assessment and treatment plan with the patient. The patient was provided an opportunity to ask questions and all were answered. The patient agreed with the plan and demonstrated an understanding of the instructions.   The patient was advised to call back or seek an in-person evaluation if the symptoms worsen or if the condition fails to improve as anticipated.   Oliver Barre, MD

## 2020-09-13 NOTE — Assessment & Plan Note (Signed)
Now Day #13 after onset symptoms, has cough and persistent mild sob but overall at least stable to improved, declines referral to ED or UC, ok for obtaining oximeter for home use with goal to stay > 90%, decadron course and cough med prn,  to f/u any worsening symptoms or concerns

## 2020-09-13 NOTE — Patient Instructions (Signed)
Please take all new medication as prescribed - the decadron and cough medicine  Please go to ED or UC for oximetry at rest or exertion less than 90%

## 2020-10-10 ENCOUNTER — Telehealth: Payer: Self-pay | Admitting: Internal Medicine

## 2020-10-10 DIAGNOSIS — Z0279 Encounter for issue of other medical certificate: Secondary | ICD-10-CM

## 2020-10-10 NOTE — Telephone Encounter (Signed)
I received a attending provider statement from [employer]. Start of leave 09/05/20 to 10/10/2020 Return to work on 10/11/2020. +COVID.  Forms have been completed and Placed in providers box to review and sign.

## 2020-10-11 NOTE — Telephone Encounter (Signed)
Forms have been signed, Faxed to [email or web address], Copy sent to scan &Charged for.   LVM to inform patient and Original mailed to patient for her records.

## 2020-12-04 ENCOUNTER — Other Ambulatory Visit: Payer: Self-pay | Admitting: Internal Medicine

## 2021-01-05 ENCOUNTER — Telehealth: Payer: Self-pay | Admitting: Internal Medicine

## 2021-01-05 DIAGNOSIS — E7800 Pure hypercholesterolemia, unspecified: Secondary | ICD-10-CM

## 2021-01-05 DIAGNOSIS — E538 Deficiency of other specified B group vitamins: Secondary | ICD-10-CM

## 2021-01-05 DIAGNOSIS — E559 Vitamin D deficiency, unspecified: Secondary | ICD-10-CM

## 2021-01-05 NOTE — Telephone Encounter (Signed)
Pt has lab appt 5/19 and CPE appt 5/24, need lab orders please.

## 2021-01-26 ENCOUNTER — Other Ambulatory Visit: Payer: Self-pay

## 2021-01-26 ENCOUNTER — Other Ambulatory Visit (INDEPENDENT_AMBULATORY_CARE_PROVIDER_SITE_OTHER): Payer: Managed Care, Other (non HMO)

## 2021-01-26 DIAGNOSIS — E559 Vitamin D deficiency, unspecified: Secondary | ICD-10-CM | POA: Diagnosis not present

## 2021-01-26 DIAGNOSIS — E7800 Pure hypercholesterolemia, unspecified: Secondary | ICD-10-CM

## 2021-01-26 DIAGNOSIS — E538 Deficiency of other specified B group vitamins: Secondary | ICD-10-CM | POA: Diagnosis not present

## 2021-01-26 LAB — BASIC METABOLIC PANEL
BUN: 15 mg/dL (ref 6–23)
CO2: 31 mEq/L (ref 19–32)
Calcium: 10.2 mg/dL (ref 8.4–10.5)
Chloride: 100 mEq/L (ref 96–112)
Creatinine, Ser: 0.86 mg/dL (ref 0.40–1.20)
GFR: 78.6 mL/min (ref >60.00)
Glucose, Bld: 102 mg/dL — ABNORMAL HIGH (ref 70–99)
Potassium: 3.3 mEq/L — ABNORMAL LOW (ref 3.5–5.1)
Sodium: 140 mEq/L (ref 135–145)

## 2021-01-26 LAB — URINALYSIS, ROUTINE W REFLEX MICROSCOPIC
Bilirubin Urine: NEGATIVE
Hgb urine dipstick: NEGATIVE
Ketones, ur: NEGATIVE
Leukocytes,Ua: NEGATIVE
Nitrite: NEGATIVE
RBC / HPF: NONE SEEN (ref 0–?)
Specific Gravity, Urine: 1.015 (ref 1.000–1.030)
Total Protein, Urine: NEGATIVE
Urine Glucose: NEGATIVE
Urobilinogen, UA: 0.2 (ref 0.0–1.0)
pH: 7 (ref 5.0–8.0)

## 2021-01-26 LAB — TSH: TSH: 2.01 u[IU]/mL (ref 0.35–4.50)

## 2021-01-26 LAB — CBC WITH DIFFERENTIAL/PLATELET
Basophils Absolute: 0.1 10*3/uL (ref 0.0–0.1)
Basophils Relative: 0.8 % (ref 0.0–3.0)
Eosinophils Absolute: 0.3 10*3/uL (ref 0.0–0.7)
Eosinophils Relative: 3.2 % (ref 0.0–5.0)
HCT: 46.5 % — ABNORMAL HIGH (ref 36.0–46.0)
Hemoglobin: 15.4 g/dL — ABNORMAL HIGH (ref 12.0–15.0)
Lymphocytes Relative: 33 % (ref 12.0–46.0)
Lymphs Abs: 2.8 10*3/uL (ref 0.7–4.0)
MCHC: 33 g/dL (ref 30.0–36.0)
MCV: 90.6 fl (ref 78.0–100.0)
Monocytes Absolute: 0.7 10*3/uL (ref 0.1–1.0)
Monocytes Relative: 7.6 % (ref 3.0–12.0)
Neutro Abs: 4.8 10*3/uL (ref 1.4–7.7)
Neutrophils Relative %: 55.4 % (ref 43.0–77.0)
Platelets: 296 10*3/uL (ref 150.0–400.0)
RBC: 5.14 Mil/uL — ABNORMAL HIGH (ref 3.87–5.11)
RDW: 14.2 % (ref 11.5–15.5)
WBC: 8.6 10*3/uL (ref 4.0–10.5)

## 2021-01-26 LAB — LIPID PANEL
Cholesterol: 325 mg/dL — ABNORMAL HIGH (ref 0–200)
HDL: 69.8 mg/dL (ref >39.00)
LDL Cholesterol: 236 mg/dL — ABNORMAL HIGH (ref 0–99)
NonHDL: 254.95
Total CHOL/HDL Ratio: 5
Triglycerides: 95 mg/dL (ref 0.0–149.0)
VLDL: 19 mg/dL (ref 0.0–40.0)

## 2021-01-26 LAB — VITAMIN D 25 HYDROXY (VIT D DEFICIENCY, FRACTURES): VITD: 20.32 ng/mL — ABNORMAL LOW (ref 30.00–100.00)

## 2021-01-26 LAB — HEPATIC FUNCTION PANEL
ALT: 19 U/L (ref 0–35)
AST: 22 U/L (ref 0–37)
Albumin: 4.5 g/dL (ref 3.5–5.2)
Alkaline Phosphatase: 90 U/L (ref 39–117)
Bilirubin, Direct: 0.1 mg/dL (ref 0.0–0.3)
Total Bilirubin: 0.4 mg/dL (ref 0.2–1.2)
Total Protein: 8 g/dL (ref 6.0–8.3)

## 2021-01-26 LAB — VITAMIN B12: Vitamin B-12: 463 pg/mL (ref 211–911)

## 2021-01-30 ENCOUNTER — Other Ambulatory Visit: Payer: Self-pay

## 2021-01-31 ENCOUNTER — Other Ambulatory Visit: Payer: Self-pay

## 2021-01-31 ENCOUNTER — Encounter: Payer: Self-pay | Admitting: Internal Medicine

## 2021-01-31 ENCOUNTER — Ambulatory Visit (INDEPENDENT_AMBULATORY_CARE_PROVIDER_SITE_OTHER): Payer: Managed Care, Other (non HMO) | Admitting: Internal Medicine

## 2021-01-31 VITALS — BP 128/80 | HR 97 | Temp 98.7°F | Ht 62.0 in | Wt 205.0 lb

## 2021-01-31 DIAGNOSIS — K219 Gastro-esophageal reflux disease without esophagitis: Secondary | ICD-10-CM

## 2021-01-31 DIAGNOSIS — K5900 Constipation, unspecified: Secondary | ICD-10-CM

## 2021-01-31 DIAGNOSIS — E785 Hyperlipidemia, unspecified: Secondary | ICD-10-CM | POA: Insufficient documentation

## 2021-01-31 DIAGNOSIS — R1012 Left upper quadrant pain: Secondary | ICD-10-CM

## 2021-01-31 DIAGNOSIS — Z1211 Encounter for screening for malignant neoplasm of colon: Secondary | ICD-10-CM | POA: Diagnosis not present

## 2021-01-31 DIAGNOSIS — E7800 Pure hypercholesterolemia, unspecified: Secondary | ICD-10-CM

## 2021-01-31 DIAGNOSIS — E559 Vitamin D deficiency, unspecified: Secondary | ICD-10-CM | POA: Insufficient documentation

## 2021-01-31 DIAGNOSIS — Z0001 Encounter for general adult medical examination with abnormal findings: Secondary | ICD-10-CM

## 2021-01-31 DIAGNOSIS — E876 Hypokalemia: Secondary | ICD-10-CM

## 2021-01-31 DIAGNOSIS — I10 Essential (primary) hypertension: Secondary | ICD-10-CM

## 2021-01-31 MED ORDER — POTASSIUM CHLORIDE CRYS ER 10 MEQ PO TBCR: 10.0000 meq | EXTENDED_RELEASE_TABLET | Freq: Every day | ORAL | 3 refills | Status: DC

## 2021-01-31 MED ORDER — ATORVASTATIN CALCIUM 40 MG PO TABS: 40.0000 mg | ORAL_TABLET | Freq: Every day | ORAL | 3 refills | Status: DC

## 2021-01-31 NOTE — Progress Notes (Signed)
Patient ID: Ellen Fernandez, female   DOB: November 30, 1970, 50 y.o.   MRN: [id number]         Chief Complaint:: wellness exam and left sided pain, hld, low vit d, hypokalemia, constipation       HPI:  Ellen Fernandez is a 50 y.o. female here for wellness exam; due for colonoscopy, declines covid vaxk o/w usp to date with preventive referrals and immunizations.                        Also c/o left flank/side/upper abd pain, dull without other radiaton, n/v, reflux, dysphagia, f/c, or blood, but also has had worsening constipation in the last month with gas, bloating.  Has also had hx of prior abd surgury x 2 complicated hernia repair then later partial bowel resection.  Trying to follow a lower chol diet, could not tolerated crestor but willing to try lipitor.  Has also had several leg cramps and low potassium.  Not taking vit d  Pt denies chest pain, increased sob or doe, wheezing, orthopnea, PND, increased LE swelling, palpitations, dizziness or syncope.   Pt denies polydipsia, polyuria, or new focal neuro s/s.    Wt Readings from Last 3 Encounters:  01/31/21 205 lb (93 kg)  01/28/20 193 lb (87.5 kg)  09/18/18 206 lb (93.4 kg)   BP Readings from Last 3 Encounters:  01/31/21 128/80  01/28/20 130/80  09/18/18 122/84   Immunization History  Administered Date(s) Administered  . Td 09/10/2005  . Tdap 02/01/2016   Health Maintenance Due  Topic Date Due  . COLONOSCOPY (Pts 45-27yrs Insurance coverage will need to be confirmed)  Never done      Past Medical History:  Diagnosis Date  . Asthma   . GERD (gastroesophageal reflux disease)   . HTN (hypertension)   . Hyperlipidemia    Past Surgical History:  Procedure Laterality Date  . ABDOMINAL HYSTERECTOMY    . partial small bowel resection  2017   complication of umbilical hernia repair  . TUBAL LIGATION    . UMBILICAL HERNIA REPAIR      reports that she has never smoked. She has never used smokeless tobacco. She reports current alcohol use. She  reports that she does not use drugs. family history includes Cancer in her father; Hypertension in her mother; Rashes / Skin problems in her daughter; Rheum arthritis in her daughter. Allergies  Allergen Reactions  . Crestor [Rosuvastatin] Other (See Comments)    Joint pain  . Sulfonamide Derivatives Hives   Current Outpatient Medications on File Prior to Visit  Medication Sig Dispense Refill  . albuterol (VENTOLIN HFA) 108 (90 Base) MCG/ACT inhaler Inhale 2 puffs into the lungs every 6 (six) hours as needed for wheezing or shortness of breath. 18 g 11  . amLODipine (NORVASC) 10 MG tablet TAKE 1 TABLET BY MOUTH EVERY DAY. MUST BE SEEN FOR LABS BEFORE FURTHER REFILLS 90 tablet 1  . azelastine (OPTIVAR) 0.05 % ophthalmic solution Place 1 drop into both eyes 2 (two) times daily. 6 mL 12  . budesonide-formoterol (SYMBICORT) 160-4.5 MCG/ACT inhaler Inhale 2 puffs into the lungs 2 (two) times daily. 10.2 g 11  . dexamethasone (DECADRON) 6 MG tablet Take 1 tablet (6 mg total) by mouth daily. 10 tablet 0  . guaiFENesin (MUCINEX) 600 MG 12 hr tablet Take 2 tablets (1,200 mg total) by mouth 2 (two) times daily as needed. 60 tablet 0  . hydrochlorothiazide (MICROZIDE) 12.5 MG capsule TAKE  1 CAPSULE(12.5 MG) BY MOUTH DAILY 90 capsule 1  . hyoscyamine (LEVSIN) 0.125 MG/5ML ELIX Take 0.125 mg by mouth every 4 (four) hours as needed.    Marland Kitchen ibuprofen (ADVIL,MOTRIN) 800 MG tablet Take 1 tablet (800 mg total) by mouth daily as needed for moderate pain. 90 tablet 3  . linaclotide (LINZESS) 145 MCG CAPS capsule Take 1 capsule (145 mcg total) by mouth daily before breakfast. 30 capsule 5  . meloxicam (MOBIC) 15 MG tablet TAKE 1 TABLET(15 MG) BY MOUTH DAILY 90 tablet 1  . omeprazole (PRILOSEC) 40 MG capsule TAKE 1 CAPSULE(40 MG) BY MOUTH DAILY 90 capsule 1  . ondansetron (ZOFRAN) 4 MG tablet Take 1 tablet (4 mg total) by mouth every 8 (eight) hours as needed for nausea or vomiting. 40 tablet 0  . rosuvastatin  (CRESTOR) 20 MG tablet Take 1 tablet (20 mg total) by mouth daily. 90 tablet 3  . triamcinolone (NASACORT) 55 MCG/ACT AERO nasal inhaler Place 2 sprays into the nose daily. 1 Inhaler 11  . zolpidem (AMBIEN) 10 MG tablet TAKE 1 TABLET(10 MG) BY MOUTH AT BEDTIME AS NEEDED FOR SLEEP 90 tablet 1   No current facility-administered medications on file prior to visit.        ROS:  All others reviewed and negative.  Objective        PE:  BP 128/80 (BP Location: Left Arm, Patient Position: Sitting, Cuff Size: Large)   Pulse 97   Temp 98.7 F (37.1 C) (Oral)   Ht 5\' 2"  (1.575 m)   Wt 205 lb (93 kg)   SpO2 95%   BMI 37.49 kg/m                 Constitutional: Pt appears in NAD               HENT: Head: NCAT.                Right Ear: External ear normal.                 Left Ear: External ear normal.                Eyes: . Pupils are equal, round, and reactive to light. Conjunctivae and EOM are normal               Nose: without d/c or deformity               Neck: Neck supple. Gross normal ROM               Cardiovascular: Normal rate and regular rhythm.                 Pulmonary/Chest: Effort normal and breath sounds without rales or wheezing.                Abd:  Soft, mod left upper quad tender without guarding or rebound, no hernia, ND, + BS, no organomegaly               Neurological: Pt is alert. At baseline orientation, motor grossly intact               Skin: Skin is warm. No rashes, no other new lesions, LE edema - none               Psychiatric: Pt behavior is normal without agitation   Micro: none  Cardiac tracings I have personally interpreted today:  none  Pertinent Radiological findings (  summarize): none   Lab Results  Component Value Date   WBC 8.6 01/26/2021   HGB 15.4 (H) 01/26/2021   HCT 46.5 (H) 01/26/2021   PLT 296.0 01/26/2021   GLUCOSE 102 (H) 01/26/2021   CHOL 325 (H) 01/26/2021   TRIG 95.0 01/26/2021   HDL 69.80 01/26/2021   LDLCALC 236 (H) 01/26/2021    ALT 19 01/26/2021   AST 22 01/26/2021   NA 140 01/26/2021   K 3.3 (L) 01/26/2021   CL 100 01/26/2021   CREATININE 0.86 01/26/2021   BUN 15 01/26/2021   CO2 31 01/26/2021   TSH 2.01 01/26/2021   Assessment/Plan:  Ellen Fernandez is a 50 y.o. Black or African American [2] female with  has a past medical history of Asthma, GERD (gastroesophageal reflux disease), HTN (hypertension), and Hyperlipidemia.  Encounter for well adult exam with abnormal findings Age and sex appropriate education and counseling updated with regular exercise and diet Referrals for preventative services - for colonoscopy Immunizations addressed - declines covid vaccination Smoking counseling  - none needed Evidence for depression or other mood disorder - none significant Most recent labs reviewed. I have personally reviewed and have noted: 1) the patient's medical and social history 2) The patient's current medications and supplements 3) The patient's height, weight, and BMI have been recorded in the chart   Left upper quadrant abdominal pain Etiology unclear, cant r/o stone vs other hernia or bowel involvement, or constipation; for CT abd/pelvis, labs as ordered,  to f/u any worsening symptoms or concerns  Constipation Pending ct results, ok for dulcolox prn,  to f/u any worsening symptoms or concerns  HTN (hypertension) BP Readings from Last 3 Encounters:  01/31/21 128/80  01/28/20 130/80  09/18/18 122/84   Stable, pt to continue medical treatment - hct   Hyperlipidemia Ok for trial change crestor to lipitor 40  GERD (gastroesophageal reflux disease) Stable, cont prilosec   Vitamin D deficiency Last vitamin D Lab Results  Component Value Date   VD25OH 20.32 (L) 01/26/2021   Low, to start oral replacement   Hypokalemia Lab Results  Component Value Date   K 3.3 (L) 01/26/2021   Low, to start kdur 10 qd  HLD (hyperlipidemia) Ok for trial change crestor to lipitor 40   Followup: Return  in about 1 year (around 01/31/2022).  Oliver Barre, MD 01/31/2021 8:56 PM [facility]

## 2021-01-31 NOTE — Patient Instructions (Addendum)
You will be contacted regarding the referral for: colonoscopy  Ok to try the otc dulcolox for constipation  Please take all new medication as prescribed - the lipitor and potassium  Please take OTC Vitamin D3 at 2000 units per day, indefinitely  Please continue all other medications as before, and refills have been done if requested.  Please have the pharmacy call with any other refills you may need.  Please continue your efforts at being more active, low cholesterol diet, and weight control.  You are otherwise up to date with prevention measures today.  Please keep your appointments with your specialists as you may have planned  You will be contacted regarding the referral for: CT scan  You will be contacted by phone if any changes need to be made immediately.  Otherwise, you will receive a letter about your results with an explanation, but please check with MyChart first.  Please remember to sign up for MyChart if you have not done so, as this will be important to you in the future with finding out test results, communicating by private email, and scheduling acute appointments online when needed.  Please make an Appointment to return for your 1 year visit, or sooner if needed, with Lab testing by Appointment as well, to be done about 3-5 days before at the FIRST FLOOR Lab (so this is for TWO appointments - please see the scheduling desk as you leave)   Due to the ongoing Covid 19 pandemic, our lab now requires an appointment for any labs done at our office.  If you need labs done and do not have an appointment, please call our office ahead of time to schedule before presenting to the lab for your testing.

## 2021-01-31 NOTE — Assessment & Plan Note (Signed)
Ok for trial change crestor to lipitor 40  

## 2021-01-31 NOTE — Assessment & Plan Note (Signed)
Lab Results  Component Value Date   K 3.3 (L) 01/26/2021   Low, to start kdur 10 qd

## 2021-01-31 NOTE — Assessment & Plan Note (Signed)
Pending ct results, ok for dulcolox prn,  to f/u any worsening symptoms or concerns

## 2021-01-31 NOTE — Assessment & Plan Note (Signed)
Last vitamin D Lab Results  Component Value Date   VD25OH 20.32 (L) 01/26/2021   Low, to start oral replacement

## 2021-01-31 NOTE — Assessment & Plan Note (Signed)
Etiology unclear, cant r/o stone vs other hernia or bowel involvement, or constipation; for CT abd/pelvis, labs as ordered,  to f/u any worsening symptoms or concerns

## 2021-01-31 NOTE — Assessment & Plan Note (Signed)
Stable, cont prilosec

## 2021-01-31 NOTE — Assessment & Plan Note (Signed)
Age and sex appropriate education and counseling updated with regular exercise and diet Referrals for preventative services - for colonoscopy Immunizations addressed - declines covid vaccination Smoking counseling  - none needed Evidence for depression or other mood disorder - none significant Most recent labs reviewed. I have personally reviewed and have noted: 1) the patient's medical and social history 2) The patient's current medications and supplements 3) The patient's height, weight, and BMI have been recorded in the chart

## 2021-01-31 NOTE — Assessment & Plan Note (Signed)
BP Readings from Last 3 Encounters:  01/31/21 128/80  01/28/20 130/80  09/18/18 122/84   Stable, pt to continue medical treatment - hct

## 2021-01-31 NOTE — Assessment & Plan Note (Signed)
Ok for trial change crestor to lipitor 40

## 2021-02-07 ENCOUNTER — Other Ambulatory Visit: Payer: Self-pay | Admitting: Internal Medicine

## 2021-02-16 ENCOUNTER — Ambulatory Visit
Admission: RE | Admit: 2021-02-16 | Discharge: 2021-02-16 | Disposition: A | Discharge status: Home / Self Care | Payer: Managed Care, Other (non HMO) | Source: Ambulatory Visit | Attending: Internal Medicine | Admitting: Internal Medicine

## 2021-02-16 ENCOUNTER — Other Ambulatory Visit: Payer: Self-pay

## 2021-02-16 DIAGNOSIS — R1012 Left upper quadrant pain: Secondary | ICD-10-CM

## 2021-02-17 ENCOUNTER — Encounter: Payer: Self-pay | Admitting: Internal Medicine

## 2021-02-17 ENCOUNTER — Other Ambulatory Visit: Payer: Self-pay | Admitting: Internal Medicine

## 2021-02-17 DIAGNOSIS — N838 Other noninflammatory disorders of ovary, fallopian tube and broad ligament: Secondary | ICD-10-CM

## 2021-02-21 ENCOUNTER — Other Ambulatory Visit: Payer: Self-pay

## 2021-02-21 ENCOUNTER — Telehealth: Payer: Self-pay | Admitting: *Deleted

## 2021-02-21 ENCOUNTER — Ambulatory Visit: Payer: Managed Care, Other (non HMO) | Admitting: Obstetrics & Gynecology

## 2021-02-21 ENCOUNTER — Encounter: Payer: Self-pay | Admitting: Obstetrics & Gynecology

## 2021-02-21 VITALS — BP 134/72 | Ht 62.0 in | Wt 205.0 lb

## 2021-02-21 DIAGNOSIS — Z9049 Acquired absence of other specified parts of digestive tract: Secondary | ICD-10-CM | POA: Diagnosis not present

## 2021-02-21 DIAGNOSIS — Z90711 Acquired absence of uterus with remaining cervical stump: Secondary | ICD-10-CM | POA: Diagnosis not present

## 2021-02-21 DIAGNOSIS — N83201 Unspecified ovarian cyst, right side: Secondary | ICD-10-CM

## 2021-02-21 NOTE — Progress Notes (Addendum)
    Linzie Criss 10-18-1970 [id number]   History:    50 y.o. G3P3L3 Presents with husband  RP:  New patient presenting for abnormal CT scan  HPI: Rt lower abdominal pain investigated with a CT scan of abdomen and pelvis without contrast on 02/16/2021 showed a bilobed fluid filled 6.4 cm right ovarian cyst.  Past medical history,surgical history, family history and social history were all reviewed and documented in the EPIC chart.  Gynecologic History No LMP recorded. Patient has had a hysterectomy.  Obstetric History OB History  Gravida Para Term Preterm AB Living  3 3       3   SAB IAB Ectopic Multiple Live Births               # Outcome Date GA Lbr Len/2nd Weight Sex Delivery Anes PTL Lv  3 Para           2 Para           1 Para              ROS: A ROS was performed and pertinent positives and negatives are included in the history.  GENERAL: No fevers or chills. HEENT: No change in vision, no earache, sore throat or sinus congestion. NECK: No pain or stiffness. CARDIOVASCULAR: No chest pain or pressure. No palpitations. PULMONARY: No shortness of breath, cough or wheeze. GASTROINTESTINAL: No abdominal pain, nausea, vomiting or diarrhea, melena or bright red blood per rectum. GENITOURINARY: No urinary frequency, urgency, hesitancy or dysuria. MUSCULOSKELETAL: No joint or muscle pain, no back pain, no recent trauma. DERMATOLOGIC: No rash, no itching, no lesions. ENDOCRINE: No polyuria, polydipsia, no heat or cold intolerance. No recent change in weight. HEMATOLOGICAL: No anemia or easy bruising or bleeding. NEUROLOGIC: No headache, seizures, numbness, tingling or weakness. PSYCHIATRIC: No depression, no loss of interest in normal activity or change in sleep pattern.     Exam:   BP 134/72   Ht 5\' 2"  (1.575 m)   Wt 205 lb (93 kg)   BMI 37.49 kg/m   Body mass index is 37.49 kg/m.  General appearance : Well developed well nourished female. No acute distress  Pelvic: Vulva:  Normal             Vagina: No gross lesions or discharge  Cervix: No gross lesions or discharge  Uterus  Surgically absent  Adnexa  Without tenderness.  Fullness in Rt adnexa.  Anus: Normal   Assessment/Plan:  50 y.o. female   1. Right ovarian cyst Right ovarian cyst bilobed fluid-filled at 6.4 cm per CT scan.  Will verify menopausal status with an [facility] today.  Tumor markers with CA125 and CEA.  Follow-up pelvic ultrasound to further investigate the right ovarian cyst.  Will manage per results. - FSH - CA 125 - CEA - 44 Transvaginal Non-OB; Future   2. H/O abdominal supracervical subtotal hysterectomy  3. History of bowel resection  High likelihood of abdominal pelvic adhesions.  [facility] MD, 11:14 AM 02/21/2021

## 2021-02-21 NOTE — Telephone Encounter (Signed)
I called [phone] and spoke with Ellen Fernandez and informed her with the below note. She is going to send a message to radiologist to address the below and create an addendum

## 2021-02-21 NOTE — Telephone Encounter (Signed)
-----   Message from Genia Del, MD sent at 02/21/2021 11:38 AM EDT ----- Regarding: Error in CT scan dictation CT scan 02/16/2021:  Please ask radiologist to correct report.  Reproduction:  S/P Appendectomy.  Should probably read S/P Hysterectomy.  Patient still has her Appendix (as far as she knows) and is S/P Supracervical Hysterectomy.

## 2021-02-22 LAB — FOLLICLE STIMULATING HORMONE: FSH: 121.9 m[IU]/mL — ABNORMAL HIGH

## 2021-02-22 LAB — CEA: CEA: 1.4 ng/mL

## 2021-02-22 LAB — CA 125: CA 125: 13 U/mL (ref <35)

## 2021-02-24 ENCOUNTER — Encounter: Payer: Self-pay | Admitting: Obstetrics & Gynecology

## 2021-03-05 ENCOUNTER — Other Ambulatory Visit: Payer: Self-pay | Admitting: Internal Medicine

## 2021-03-05 NOTE — Telephone Encounter (Signed)
Please refill as per office routine med refill policy (all routine meds refilled for 3 mo or monthly per pt preference up to one year from last visit, then month to month grace period for 3 mo, then further med refills will have to be denied)  

## 2021-03-05 NOTE — Telephone Encounter (Signed)
Sorry no refill needed 

## 2021-03-15 NOTE — Progress Notes (Deleted)
v

## 2021-03-16 ENCOUNTER — Ambulatory Visit: Payer: Managed Care, Other (non HMO) | Admitting: Obstetrics & Gynecology

## 2021-03-16 ENCOUNTER — Other Ambulatory Visit: Payer: Self-pay

## 2021-03-16 ENCOUNTER — Ambulatory Visit (INDEPENDENT_AMBULATORY_CARE_PROVIDER_SITE_OTHER): Payer: Managed Care, Other (non HMO)

## 2021-03-16 ENCOUNTER — Encounter: Payer: Self-pay | Admitting: Obstetrics & Gynecology

## 2021-03-16 VITALS — BP 118/80

## 2021-03-16 DIAGNOSIS — Z90711 Acquired absence of uterus with remaining cervical stump: Secondary | ICD-10-CM

## 2021-03-16 DIAGNOSIS — N83201 Unspecified ovarian cyst, right side: Secondary | ICD-10-CM | POA: Diagnosis not present

## 2021-03-16 DIAGNOSIS — Z9049 Acquired absence of other specified parts of digestive tract: Secondary | ICD-10-CM | POA: Diagnosis not present

## 2021-03-16 NOTE — Progress Notes (Signed)
    Alejah Aristizabal 1971-08-14 [id number]        50 y.o. G3P3L3 Presents with husband   RP: Ovarian cyst on CT scan for Pelvic US   HPI: Rt lower abdominal pain now resolved.  S/P supracervical Hysterectomy.  H/O Bowel resection and Umbilical hernia repair.  Rt lower abdominal pain investigated with a CT scan of abdomen and pelvis without contrast on 02/16/2021 showed a bilobed fluid filled 6.4 cm right ovarian cyst.  Ca 125 and CEA Negative 02/21/2021.   OB History  Gravida Para Term Preterm AB Living  3 3       3   SAB IAB Ectopic Multiple Live Births               # Outcome Date GA Lbr Len/2nd Weight Sex Delivery Anes PTL Lv  3 Para           2 Para           1 Para             Past medical history,surgical history, problem list, medications, allergies, family history and social history were all reviewed and documented in the EPIC chart.   Directed ROS with pertinent positives and negatives documented in the history of present illness/assessment and plan.  Exam:  Vitals:   03/16/21 1030  BP: 118/80   General appearance:  Normal  Pelvic 05/17/21 today: T/V images.  Uterus is surgically absent.  Appearance of cervical cuff normal.  Right ovarian tissue not seen.  Right ovarian cyst identified with transabdominal ultrasound only due to high position in the right pelvis.  Thick-walled cyst measured at 5.85 x 3.56 cm with an echogenic thin septation.  The cyst is avascular and tubular involvement cannot be ruled out.  Left ovary is normal.  No other adnexal mass.  No free fluid in the pelvis.  Ca 125 and CEA Neg on 02/21/2021   Assessment/Plan:  51 y.o. G3P3   1. Right ovarian cyst Right ovarian cyst seen on CT scan.  Pelvic ultrasound findings thoroughly reviewed with patient.  Right ovary presents a stable 5.85 x 3.56 cm cyst which is avascular with a thin septation and includes a probable hydrosalpinx.  The left ovary is normal.  No free fluid in the pelvis.  CA125 and CEA negative.   Given the high risk of surgery given the history of umbilical hernia repair and bowel resection and the probable benign nature of the right ovarian cyst, the decision was made to observe with a repeat ultrasound at 3 months.  Management at that time will depend on findings. - 43 Transvaginal Non-OB; Future  2. H/O abdominal supracervical subtotal hysterectomy  3. History of bowel resection History of umbilical hernia repair with bowel resection.  Other orders - fluticasone (FLONASE) 50 MCG/ACT nasal spray; one spray by Both Nostrils route daily.   [place] MD, 10:48 AM 03/16/2021

## 2021-03-19 ENCOUNTER — Encounter: Payer: Self-pay | Admitting: Obstetrics & Gynecology

## 2021-05-30 ENCOUNTER — Other Ambulatory Visit: Payer: Self-pay | Admitting: Internal Medicine

## 2021-05-30 NOTE — Telephone Encounter (Signed)
Please refill as per office routine med refill policy (all routine meds to be refilled for 3 mo or monthly (per pt preference) up to one year from last visit, then month to month grace period for 3 mo, then further med refills will have to be denied) ? ?

## 2021-07-18 ENCOUNTER — Other Ambulatory Visit: Payer: Managed Care, Other (non HMO) | Admitting: Obstetrics & Gynecology

## 2021-07-18 ENCOUNTER — Other Ambulatory Visit: Payer: Managed Care, Other (non HMO)

## 2021-07-18 DIAGNOSIS — Z0289 Encounter for other administrative examinations: Secondary | ICD-10-CM

## 2021-08-04 ENCOUNTER — Other Ambulatory Visit: Payer: Self-pay | Admitting: Internal Medicine

## 2021-11-30 ENCOUNTER — Other Ambulatory Visit: Payer: Self-pay | Admitting: Internal Medicine

## 2021-12-01 NOTE — Telephone Encounter (Signed)
Please refill as per office routine med refill policy (all routine meds to be refilled for 3 mo or monthly (per pt preference) up to one year from last visit, then month to month grace period for 3 mo, then further med refills will have to be denied) ? ?

## 2022-01-30 ENCOUNTER — Other Ambulatory Visit (INDEPENDENT_AMBULATORY_CARE_PROVIDER_SITE_OTHER): Payer: Managed Care, Other (non HMO)

## 2022-01-30 DIAGNOSIS — E559 Vitamin D deficiency, unspecified: Secondary | ICD-10-CM

## 2022-01-30 DIAGNOSIS — Z0001 Encounter for general adult medical examination with abnormal findings: Secondary | ICD-10-CM

## 2022-01-31 LAB — BASIC METABOLIC PANEL
BUN: 20 mg/dL (ref 6–23)
CO2: 29 mEq/L (ref 19–32)
Calcium: 9.8 mg/dL (ref 8.4–10.5)
Chloride: 96 mEq/L (ref 96–112)
Creatinine, Ser: 0.85 mg/dL (ref 0.40–1.20)
GFR: 79.14 mL/min (ref >60.00)
Glucose, Bld: 81 mg/dL (ref 70–99)
Potassium: 3.4 mEq/L — ABNORMAL LOW (ref 3.5–5.1)
Sodium: 136 mEq/L (ref 135–145)

## 2022-01-31 LAB — CBC WITH DIFFERENTIAL/PLATELET
Basophils Absolute: 0.2 10*3/uL — ABNORMAL HIGH (ref 0.0–0.1)
Basophils Relative: 1.5 % (ref 0.0–3.0)
Eosinophils Absolute: 0.3 10*3/uL (ref 0.0–0.7)
Eosinophils Relative: 2.9 % (ref 0.0–5.0)
HCT: 43 % (ref 36.0–46.0)
Hemoglobin: 14.4 g/dL (ref 12.0–15.0)
Lymphocytes Relative: 32.2 % (ref 12.0–46.0)
Lymphs Abs: 3.2 10*3/uL (ref 0.7–4.0)
MCHC: 33.5 g/dL (ref 30.0–36.0)
MCV: 90.2 fl (ref 78.0–100.0)
Monocytes Absolute: 0.8 10*3/uL (ref 0.1–1.0)
Monocytes Relative: 8.1 % (ref 3.0–12.0)
Neutro Abs: 5.6 10*3/uL (ref 1.4–7.7)
Neutrophils Relative %: 55.3 % (ref 43.0–77.0)
Platelets: 323 10*3/uL (ref 150.0–400.0)
RBC: 4.77 Mil/uL (ref 3.87–5.11)
RDW: 15.3 % (ref 11.5–15.5)
WBC: 10 10*3/uL (ref 4.0–10.5)

## 2022-01-31 LAB — URINALYSIS, ROUTINE W REFLEX MICROSCOPIC
Bilirubin Urine: NEGATIVE
Hgb urine dipstick: NEGATIVE
Ketones, ur: NEGATIVE
Leukocytes,Ua: NEGATIVE
Nitrite: NEGATIVE
Specific Gravity, Urine: 1.01 (ref 1.000–1.030)
Total Protein, Urine: NEGATIVE
Urine Glucose: NEGATIVE
Urobilinogen, UA: 0.2 (ref 0.0–1.0)
pH: 6.5 (ref 5.0–8.0)

## 2022-01-31 LAB — LIPID PANEL
Cholesterol: 304 mg/dL — ABNORMAL HIGH (ref 0–200)
HDL: 70.1 mg/dL (ref >39.00)
LDL Cholesterol: 206 mg/dL — ABNORMAL HIGH (ref 0–99)
NonHDL: 234.1
Total CHOL/HDL Ratio: 4
Triglycerides: 141 mg/dL (ref 0.0–149.0)
VLDL: 28.2 mg/dL (ref 0.0–40.0)

## 2022-01-31 LAB — HEPATIC FUNCTION PANEL
ALT: 19 U/L (ref 0–35)
AST: 20 U/L (ref 0–37)
Albumin: 4.5 g/dL (ref 3.5–5.2)
Alkaline Phosphatase: 98 U/L (ref 39–117)
Bilirubin, Direct: 0.1 mg/dL (ref 0.0–0.3)
Total Bilirubin: 0.4 mg/dL (ref 0.2–1.2)
Total Protein: 8 g/dL (ref 6.0–8.3)

## 2022-01-31 LAB — VITAMIN D 25 HYDROXY (VIT D DEFICIENCY, FRACTURES): VITD: 19.79 ng/mL — ABNORMAL LOW (ref 30.00–100.00)

## 2022-01-31 LAB — TSH: TSH: 2.61 u[IU]/mL (ref 0.35–5.50)

## 2022-02-01 ENCOUNTER — Ambulatory Visit (INDEPENDENT_AMBULATORY_CARE_PROVIDER_SITE_OTHER): Payer: Managed Care, Other (non HMO) | Admitting: Internal Medicine

## 2022-02-01 VITALS — BP 126/80 | HR 98 | Temp 98.4°F | Ht 62.0 in | Wt 213.0 lb

## 2022-02-01 DIAGNOSIS — Z0001 Encounter for general adult medical examination with abnormal findings: Secondary | ICD-10-CM

## 2022-02-01 DIAGNOSIS — I10 Essential (primary) hypertension: Secondary | ICD-10-CM

## 2022-02-01 DIAGNOSIS — G720 Drug-induced myopathy: Secondary | ICD-10-CM

## 2022-02-01 DIAGNOSIS — K5900 Constipation, unspecified: Secondary | ICD-10-CM | POA: Diagnosis not present

## 2022-02-01 DIAGNOSIS — T466X5A Adverse effect of antihyperlipidemic and antiarteriosclerotic drugs, initial encounter: Secondary | ICD-10-CM

## 2022-02-01 DIAGNOSIS — E876 Hypokalemia: Secondary | ICD-10-CM | POA: Diagnosis not present

## 2022-02-01 DIAGNOSIS — E669 Obesity, unspecified: Secondary | ICD-10-CM | POA: Diagnosis not present

## 2022-02-01 DIAGNOSIS — E7800 Pure hypercholesterolemia, unspecified: Secondary | ICD-10-CM

## 2022-02-01 DIAGNOSIS — E559 Vitamin D deficiency, unspecified: Secondary | ICD-10-CM

## 2022-02-01 DIAGNOSIS — R9431 Abnormal electrocardiogram [ECG] [EKG]: Secondary | ICD-10-CM | POA: Diagnosis not present

## 2022-02-01 MED ORDER — WEGOVY 0.5 MG/0.5ML ~~LOC~~ SOAJ: 0.5000 mg | SUBCUTANEOUS | 11 refills | Status: AC

## 2022-02-01 MED ORDER — FLUTICASONE PROPIONATE 50 MCG/ACT NA SUSP: NASAL | 11 refills | Status: DC

## 2022-02-01 MED ORDER — POTASSIUM CHLORIDE CRYS ER 10 MEQ PO TBCR: 20.0000 meq | EXTENDED_RELEASE_TABLET | Freq: Every day | ORAL | 3 refills | Status: AC

## 2022-02-01 MED ORDER — TRULANCE 3 MG PO TABS: ORAL_TABLET | ORAL | 3 refills | Status: DC

## 2022-02-01 NOTE — Patient Instructions (Addendum)
Please take OTC Vitamin D3 at 2000 units per day, indefinitely  Ok to try the samples of trulance for constipation  Ok to increase the potassium pills to 2 per day  Ok to stop the lipitor as you have  You will be contacted regarding the referral for: Lipid Clinic  We have discussed the Cardiac CT Score test to measure the calcification level (if any) in your heart arteries.  This test has been ordered in our Computer System, so please call [facility] directly, as they prefer this, at [phone] to be scheduled.  Please take all new medication as prescribed  - the wegovy (and you may need to check with HR at [facility] to see if they cover this even though your insurance itself may no)  Please continue all other medications as before, and refills have been done if requested.  Please have the pharmacy call with any other refills you may need.  Please continue your efforts at being more active, low cholesterol diet, and weight control.  You are otherwise up to date with prevention measures today.  Please keep your appointments with your specialists as you may have planned  Please make an Appointment to return in 6 months, or sooner if needed

## 2022-02-01 NOTE — Progress Notes (Signed)
Patient ID: Ellen Fernandez, female   DOB: [birth year], 51 y.o.   MRN: [id number]         Chief Complaint:: wellness exam and low vit d, hld, low K, constipation, obesity       HPI:  Ellen Fernandez is a 51 y.o. female here for wellness exam; decliens covid booster, shingrix, colonoscopy, o/w up to date                        Also not taking Vit d.  Unable to tolerate lipitor due to myalgia, also crestor prior as well.  Denies worsening reflux, abd pain, dysphagia, n/v, or blood, but has persistent constipation and linzess not working well.  Also unable to lose wt significant with exercise and diet.  Pt denies chest pain, increased sob or doe, wheezing, orthopnea, PND, increased LE swelling, palpitations, dizziness or syncope.   Pt denies polydipsia, polyuria, or new focal neuro s/s.    Pt denies fever, wt loss, night sweats, loss of appetite, or other constitutional symptoms  Pt interested in cardiac CT score   Wt Readings from Last 3 Encounters:  02/01/22 213 lb (96.6 kg)  02/21/21 205 lb (93 kg)  01/31/21 205 lb (93 kg)   BP Readings from Last 3 Encounters:  02/01/22 126/80  03/16/21 118/80  02/21/21 134/72   Immunization History  Administered Date(s) Administered   Td 09/10/2005   Tdap 02/01/2016  There are no preventive care reminders to display for this patient.    Past Medical History:  Diagnosis Date   Asthma    GERD (gastroesophageal reflux disease)    HTN (hypertension)    Hyperlipidemia    Past Surgical History:  Procedure Laterality Date   ABDOMINAL HYSTERECTOMY     partial small bowel resection  2017   complication of umbilical hernia repair   TUBAL LIGATION     UMBILICAL HERNIA REPAIR      reports that she has never smoked. She has never used smokeless tobacco. She reports that she does not currently use alcohol. She reports that she does not use drugs. family history includes Cancer in her father; Hypertension in her mother; Rashes / Skin problems in her daughter; Rheum  arthritis in her daughter. Allergies  Allergen Reactions   Crestor [Rosuvastatin] Other (See Comments)    Joint pain   Lipitor [Atorvastatin] Other (See Comments)    Muscle pain   Sulfonamide Derivatives Hives   Current Outpatient Medications on File Prior to Visit  Medication Sig Dispense Refill   albuterol (VENTOLIN HFA) 108 (90 Base) MCG/ACT inhaler Inhale 2 puffs into the lungs every 6 (six) hours as needed for wheezing or shortness of breath. 18 g 11   amLODipine (NORVASC) 10 MG tablet TAKE 1 TABLET BY MOUTH EVERY DAY 90 tablet 0   hydrochlorothiazide (MICROZIDE) 12.5 MG capsule TAKE 1 CAPSULE(12.5 MG) BY MOUTH DAILY 90 capsule 0   ibuprofen (ADVIL,MOTRIN) 800 MG tablet Take 1 tablet (800 mg total) by mouth daily as needed for moderate pain. 90 tablet 3   meloxicam (MOBIC) 15 MG tablet TAKE 1 TABLET(15 MG) BY MOUTH DAILY 90 tablet 1   omeprazole (PRILOSEC) 40 MG capsule TAKE 1 CAPSULE(40 MG) BY MOUTH DAILY 90 capsule 0   ondansetron (ZOFRAN) 4 MG tablet Take 1 tablet (4 mg total) by mouth every 8 (eight) hours as needed for nausea or vomiting. 40 tablet 0   zolpidem (AMBIEN) 10 MG tablet TAKE 1 TABLET(10 MG) BY MOUTH  AT BEDTIME AS NEEDED FOR SLEEP 90 tablet 1   No current facility-administered medications on file prior to visit.        ROS:  All others reviewed and negative.  Objective        PE:  BP 126/80 (BP Location: Right Arm, Patient Position: Sitting, Cuff Size: Large)   Pulse 98   Temp 98.4 F (36.9 C) (Oral)   Ht 5\' 2"  (1.575 m)   Wt 213 lb (96.6 kg)   SpO2 93%   BMI 38.96 kg/m                 Constitutional: Pt appears in NAD               HENT: Head: NCAT.                Right Ear: External ear normal.                 Left Ear: External ear normal.                Eyes: . Pupils are equal, round, and reactive to light. Conjunctivae and EOM are normal               Nose: without d/c or deformity               Neck: Neck supple. Gross normal ROM                Cardiovascular: Normal rate and regular rhythm.                 Pulmonary/Chest: Effort normal and breath sounds without rales or wheezing.                Abd:  Soft, NT, ND, + BS, no organomegaly               Neurological: Pt is alert. At baseline orientation, motor grossly intact               Skin: Skin is warm. No rashes, no other new lesions, LE edema - none               Psychiatric: Pt behavior is normal without agitation   Micro: none  Cardiac tracings I have personally interpreted today:  none  Pertinent Radiological findings (summarize): none   Lab Results  Component Value Date   WBC 10.0 01/30/2022   HGB 14.4 01/30/2022   HCT 43.0 01/30/2022   PLT 323.0 01/30/2022   GLUCOSE 81 01/30/2022   CHOL 304 (H) 01/30/2022   TRIG 141.0 01/30/2022   HDL 70.10 01/30/2022   LDLCALC 206 (H) 01/30/2022   ALT 19 01/30/2022   AST 20 01/30/2022   NA 136 01/30/2022   K 3.4 (L) 01/30/2022   CL 96 01/30/2022   CREATININE 0.85 01/30/2022   BUN 20 01/30/2022   CO2 29 01/30/2022   TSH 2.61 01/30/2022   Assessment/Plan:  Ellen Fernandez is a 51 y.o. Black or African American [2] female with  has a past medical history of Asthma, GERD (gastroesophageal reflux disease), HTN (hypertension), and Hyperlipidemia.  Encounter for well adult exam with abnormal findings Age and sex appropriate education and counseling updated with regular exercise and diet Referrals for preventative services - declines colonoscopy Immunizations addressed - declines covid booster, shingrix Smoking counseling  - none needed Evidence for depression or other mood disorder - none significant Most recent labs reviewed. I have personally reviewed and have  noted: 1) the patient's medical and social history 2) The patient's current medications and supplements 3) The patient's height, weight, and BMI have been recorded in the chart   Vitamin D deficiency Last vitamin D Lab Results  Component Value Date   VD25OH  19.79 (L) 01/30/2022   Low, to start oral replacement   Hypokalemia Low, for increase K to 10 meq - 2 qd  HTN (hypertension) BP Readings from Last 3 Encounters:  02/01/22 126/80  03/16/21 118/80  02/21/21 134/72   Stable, pt to continue medical treatment norvasc  HLD (hyperlipidemia) Lab Results  Component Value Date   LDLCALC 206 (H) 01/30/2022   Severe uncontrolled, pt has been statin intolerant - for lipid clinic referral, also check Card CT score   Constipation Chronic persistent worse recently - for change to trulance if ok with insurance  Statin myopathy D/c lipitor  Obesity (BMI 30.0-34.9) D/w pt - for wegovy if ok with insurance  Followup: Return in about 6 months (around 08/04/2022).  Oliver Barre, MD 02/04/2022 8:02 AM [facility]

## 2022-02-04 ENCOUNTER — Encounter: Payer: Self-pay | Admitting: Internal Medicine

## 2022-02-04 DIAGNOSIS — E669 Obesity, unspecified: Secondary | ICD-10-CM | POA: Insufficient documentation

## 2022-02-04 NOTE — Assessment & Plan Note (Signed)
D/w pt - for wegovy if ok with insurance 

## 2022-02-04 NOTE — Assessment & Plan Note (Signed)
Age and sex appropriate education and counseling updated with regular exercise and diet Referrals for preventative services - declines colonoscopy Immunizations addressed - declines covid booster, shingrix Smoking counseling  - none needed Evidence for depression or other mood disorder - none significant Most recent labs reviewed. I have personally reviewed and have noted: 1) the patient's medical and social history 2) The patient's current medications and supplements 3) The patient's height, weight, and BMI have been recorded in the chart  

## 2022-02-04 NOTE — Assessment & Plan Note (Signed)
Low, for increase K to 10 meq - 2 qd

## 2022-02-04 NOTE — Assessment & Plan Note (Signed)
Last vitamin D Lab Results  Component Value Date   VD25OH 19.79 (L) 01/30/2022   Low, to start oral replacement  

## 2022-02-04 NOTE — Assessment & Plan Note (Signed)
Chronic persistent worse recently - for change to trulance if ok with insurance

## 2022-02-04 NOTE — Assessment & Plan Note (Signed)
Lab Results  Component Value Date   LDLCALC 206 (H) 01/30/2022   Severe uncontrolled, pt has been statin intolerant - for lipid clinic referral, also check Card CT score

## 2022-02-04 NOTE — Assessment & Plan Note (Signed)
D/c lipitor

## 2022-02-04 NOTE — Assessment & Plan Note (Signed)
BP Readings from Last 3 Encounters:  02/01/22 126/80  03/16/21 118/80  02/21/21 134/72   Stable, pt to continue medical treatment norvasc

## 2022-02-06 ENCOUNTER — Telehealth: Payer: Self-pay

## 2022-02-06 MED ORDER — ZOLPIDEM TARTRATE 10 MG PO TABS: ORAL_TABLET | ORAL | 1 refills | Status: DC

## 2022-02-06 NOTE — Telephone Encounter (Signed)
Done earlier today

## 2022-02-06 NOTE — Telephone Encounter (Signed)
Pt is requesting a refill on: zolpidem (AMBIEN) 10 MG tablet  Pharmacy: [id number] [place],  - [address] AT [facility] [address]  LOV 02/01/22 ROV 08/06/22

## 2022-02-09 ENCOUNTER — Telehealth: Payer: Self-pay

## 2022-02-09 NOTE — Telephone Encounter (Signed)
PA started     Key: [facility]

## 2022-02-21 ENCOUNTER — Telehealth: Payer: Self-pay

## 2022-02-21 NOTE — Telephone Encounter (Signed)
PA started   (Key: [id number])

## 2022-02-28 ENCOUNTER — Other Ambulatory Visit: Payer: Self-pay

## 2022-02-28 MED ORDER — OMEPRAZOLE 40 MG PO CPDR: DELAYED_RELEASE_CAPSULE | ORAL | 0 refills | Status: DC

## 2022-02-28 MED ORDER — AMLODIPINE BESYLATE 10 MG PO TABS: 10.0000 mg | ORAL_TABLET | Freq: Every day | ORAL | 0 refills | Status: DC

## 2022-02-28 MED ORDER — MELOXICAM 15 MG PO TABS: ORAL_TABLET | ORAL | 1 refills | Status: DC

## 2022-02-28 MED ORDER — HYDROCHLOROTHIAZIDE 12.5 MG PO CAPS: ORAL_CAPSULE | ORAL | 0 refills | Status: DC

## 2022-03-01 ENCOUNTER — Encounter: Payer: Self-pay | Admitting: Internal Medicine

## 2022-04-09 NOTE — Telephone Encounter (Signed)
Message from plan: Request Reference Number: [id number]. WEGOVY INJ 0.5MG  is approved through 09/23/2022. Your patient may now fill this prescription and it will be covered.

## 2022-05-17 ENCOUNTER — Telehealth: Payer: Self-pay

## 2022-05-17 NOTE — Telephone Encounter (Signed)
PA started and sent to plan  Key: [id number]

## 2022-05-31 ENCOUNTER — Other Ambulatory Visit: Payer: Self-pay | Admitting: Internal Medicine

## 2022-05-31 MED ORDER — OMEPRAZOLE 40 MG PO CPDR: DELAYED_RELEASE_CAPSULE | ORAL | 1 refills | Status: DC

## 2022-06-15 NOTE — Telephone Encounter (Signed)
Incoming fax from [place], [place] for Trulance DENIED.

## 2022-06-25 ENCOUNTER — Other Ambulatory Visit: Payer: Self-pay

## 2022-06-25 MED ORDER — AMLODIPINE BESYLATE 10 MG PO TABS: 10.0000 mg | ORAL_TABLET | Freq: Every day | ORAL | 1 refills | Status: DC

## 2022-06-25 MED ORDER — HYDROCHLOROTHIAZIDE 12.5 MG PO CAPS: ORAL_CAPSULE | ORAL | 1 refills | Status: DC

## 2022-07-31 ENCOUNTER — Telehealth: Payer: Managed Care, Other (non HMO) | Admitting: Internal Medicine

## 2022-07-31 ENCOUNTER — Encounter: Payer: Self-pay | Admitting: Internal Medicine

## 2022-07-31 DIAGNOSIS — E559 Vitamin D deficiency, unspecified: Secondary | ICD-10-CM

## 2022-07-31 NOTE — Progress Notes (Signed)
Patient ID: Ellen Fernandez, female   DOB: 1970/11/03, 51 y.o.   MRN: [id number]  Pt not seen, unable to contact by video or phone  .  I agree with above. Oliver Barre, MD

## 2022-07-31 NOTE — Patient Instructions (Signed)
Pt not seen.

## 2022-08-06 ENCOUNTER — Ambulatory Visit: Payer: Managed Care, Other (non HMO) | Admitting: Internal Medicine

## 2022-08-06 ENCOUNTER — Telehealth: Payer: Self-pay | Admitting: Internal Medicine

## 2022-08-06 MED ORDER — ZOLPIDEM TARTRATE 10 MG PO TABS: ORAL_TABLET | ORAL | 1 refills | Status: DC

## 2022-08-06 NOTE — Telephone Encounter (Signed)
Ok done erx 

## 2022-08-06 NOTE — Telephone Encounter (Signed)
Patient had a video visit on 11/21

## 2022-08-06 NOTE — Telephone Encounter (Signed)
Patient needs a zolpidem refilled.  Please send to [facility] on [address], [place]  Next visit:09/11/2022

## 2022-09-11 ENCOUNTER — Ambulatory Visit: Payer: Managed Care, Other (non HMO) | Admitting: Internal Medicine

## 2022-10-04 ENCOUNTER — Other Ambulatory Visit: Payer: Self-pay | Admitting: Internal Medicine

## 2022-10-11 ENCOUNTER — Other Ambulatory Visit: Payer: Self-pay | Admitting: Internal Medicine

## 2022-10-11 ENCOUNTER — Ambulatory Visit (INDEPENDENT_AMBULATORY_CARE_PROVIDER_SITE_OTHER): Payer: BC Managed Care – PPO | Admitting: Internal Medicine

## 2022-10-11 ENCOUNTER — Encounter: Payer: Self-pay | Admitting: Internal Medicine

## 2022-10-11 VITALS — BP 126/74 | HR 98 | Temp 98.2°F | Ht 62.0 in | Wt 214.0 lb

## 2022-10-11 DIAGNOSIS — J309 Allergic rhinitis, unspecified: Secondary | ICD-10-CM | POA: Diagnosis not present

## 2022-10-11 DIAGNOSIS — E538 Deficiency of other specified B group vitamins: Secondary | ICD-10-CM

## 2022-10-11 DIAGNOSIS — E7800 Pure hypercholesterolemia, unspecified: Secondary | ICD-10-CM

## 2022-10-11 DIAGNOSIS — R42 Dizziness and giddiness: Secondary | ICD-10-CM

## 2022-10-11 DIAGNOSIS — E559 Vitamin D deficiency, unspecified: Secondary | ICD-10-CM | POA: Diagnosis not present

## 2022-10-11 DIAGNOSIS — Z0001 Encounter for general adult medical examination with abnormal findings: Secondary | ICD-10-CM | POA: Diagnosis not present

## 2022-10-11 DIAGNOSIS — I10 Essential (primary) hypertension: Secondary | ICD-10-CM

## 2022-10-11 DIAGNOSIS — R739 Hyperglycemia, unspecified: Secondary | ICD-10-CM

## 2022-10-11 DIAGNOSIS — E669 Obesity, unspecified: Secondary | ICD-10-CM

## 2022-10-11 DIAGNOSIS — Z1211 Encounter for screening for malignant neoplasm of colon: Secondary | ICD-10-CM | POA: Diagnosis not present

## 2022-10-11 LAB — CBC WITH DIFFERENTIAL/PLATELET
Basophils Absolute: 0.1 10*3/uL (ref 0.0–0.1)
Basophils Relative: 1 % (ref 0.0–3.0)
Eosinophils Absolute: 0.3 10*3/uL (ref 0.0–0.7)
Eosinophils Relative: 3.7 % (ref 0.0–5.0)
HCT: 43.1 % (ref 36.0–46.0)
Hemoglobin: 14.4 g/dL (ref 12.0–15.0)
Lymphocytes Relative: 30.8 % (ref 12.0–46.0)
Lymphs Abs: 2.4 10*3/uL (ref 0.7–4.0)
MCHC: 33.5 g/dL (ref 30.0–36.0)
MCV: 91.3 fl (ref 78.0–100.0)
Monocytes Absolute: 0.6 10*3/uL (ref 0.1–1.0)
Monocytes Relative: 7.6 % (ref 3.0–12.0)
Neutro Abs: 4.4 10*3/uL (ref 1.4–7.7)
Neutrophils Relative %: 56.9 % (ref 43.0–77.0)
Platelets: 297 10*3/uL (ref 150.0–400.0)
RBC: 4.72 Mil/uL (ref 3.87–5.11)
RDW: 14.7 % (ref 11.5–15.5)
WBC: 7.7 10*3/uL (ref 4.0–10.5)

## 2022-10-11 LAB — HEMOGLOBIN A1C: Hgb A1c MFr Bld: 5.8 % (ref 4.6–6.5)

## 2022-10-11 LAB — URINALYSIS, ROUTINE W REFLEX MICROSCOPIC
Bilirubin Urine: NEGATIVE
Hgb urine dipstick: NEGATIVE
Ketones, ur: NEGATIVE
Leukocytes,Ua: NEGATIVE
Nitrite: NEGATIVE
RBC / HPF: NONE SEEN (ref 0–?)
Specific Gravity, Urine: 1.02 (ref 1.000–1.030)
Total Protein, Urine: NEGATIVE
Urine Glucose: NEGATIVE
Urobilinogen, UA: 0.2 (ref 0.0–1.0)
pH: 8 (ref 5.0–8.0)

## 2022-10-11 LAB — LIPID PANEL
Cholesterol: 298 mg/dL — ABNORMAL HIGH (ref 0–200)
HDL: 74.2 mg/dL (ref >39.00)
LDL Cholesterol: 204 mg/dL — ABNORMAL HIGH (ref 0–99)
NonHDL: 224.15
Total CHOL/HDL Ratio: 4
Triglycerides: 99 mg/dL (ref 0.0–149.0)
VLDL: 19.8 mg/dL (ref 0.0–40.0)

## 2022-10-11 LAB — BASIC METABOLIC PANEL
BUN: 13 mg/dL (ref 6–23)
CO2: 32 mEq/L (ref 19–32)
Calcium: 9.9 mg/dL (ref 8.4–10.5)
Chloride: 101 mEq/L (ref 96–112)
Creatinine, Ser: 0.81 mg/dL (ref 0.40–1.20)
GFR: 83.45 mL/min (ref >60.00)
Glucose, Bld: 98 mg/dL (ref 70–99)
Potassium: 4.2 mEq/L (ref 3.5–5.1)
Sodium: 139 mEq/L (ref 135–145)

## 2022-10-11 LAB — VITAMIN D 25 HYDROXY (VIT D DEFICIENCY, FRACTURES): VITD: 26.03 ng/mL — ABNORMAL LOW (ref 30.00–100.00)

## 2022-10-11 LAB — HEPATIC FUNCTION PANEL
ALT: 20 U/L (ref 0–35)
AST: 22 U/L (ref 0–37)
Albumin: 4.5 g/dL (ref 3.5–5.2)
Alkaline Phosphatase: 100 U/L (ref 39–117)
Bilirubin, Direct: 0.1 mg/dL (ref 0.0–0.3)
Total Bilirubin: 0.4 mg/dL (ref 0.2–1.2)
Total Protein: 8.1 g/dL (ref 6.0–8.3)

## 2022-10-11 LAB — TSH: TSH: 2.17 u[IU]/mL (ref 0.35–5.50)

## 2022-10-11 LAB — VITAMIN B12: Vitamin B-12: 636 pg/mL (ref 211–911)

## 2022-10-11 MED ORDER — ZEPBOUND 2.5 MG/0.5ML ~~LOC~~ SOAJ: 2.5000 mg | SUBCUTANEOUS | 11 refills | Status: DC

## 2022-10-11 MED ORDER — EZETIMIBE 10 MG PO TABS: 10.0000 mg | ORAL_TABLET | Freq: Every day | ORAL | 3 refills | Status: DC

## 2022-10-11 NOTE — Assessment & Plan Note (Signed)
Uncontrolled, for restart allegra and nasacort, also add otc zaditor to eyes

## 2022-10-11 NOTE — Assessment & Plan Note (Signed)
BP Readings from Last 3 Encounters:  10/11/22 126/74  02/01/22 126/80  03/16/21 118/80   Stable, pt to continue medical treatment norvasc 10 mg qd, hct 12.5 mg qd

## 2022-10-11 NOTE — Assessment & Plan Note (Signed)
Age and sex appropriate education and counseling updated with regular exercise and diet Referrals for preventative services - for colonoscopy Immunizations addressed - declines covid booster, flu shot, but ok for shingrix at pharmacy Smoking counseling  - none needed Evidence for depression or other mood disorder - none significant Most recent labs reviewed. I have personally reviewed and have noted: 1) the patient's medical and social history 2) The patient's current medications and supplements 3) The patient's height, weight, and BMI have been recorded in the chart

## 2022-10-11 NOTE — Assessment & Plan Note (Signed)
Lab Results  Component Value Date   LDLCALC 206 (H) 01/30/2022   Very severe elev ldl, to continue low chol diet, , pt not been able to tolerate statins, for f/u lipids, consider add zetia

## 2022-10-11 NOTE — Assessment & Plan Note (Signed)
Unable to get wegovy due to backorder, ok to try change to zepbound 2.5 mg weekly

## 2022-10-11 NOTE — Patient Instructions (Addendum)
You will be contacted regarding the referral for: colonoscopy  Please have your Shingrix (shingles) shots done at your local pharmacy.  Ok to try the Youth worker for eye allergies  Please take all new medication as prescribed  - the Zepbound 2.5 mg weekly, and call for higher dose in 1 month if you are doing ok and need more wt loss  You can also take OTC dramamine for the dizziness if recurs  Please continue all other medications as before, and refills have been done if requested.  Please have the pharmacy call with any other refills you may need.  Please continue your efforts at being more active, low cholesterol diet, and weight control.  You are otherwise up to date with prevention measures today.  Please keep your appointments with your specialists as you may have planned  Please make an Appointment to return for your 1 year visit, or sooner if needed, with Lab testing by Appointment as well, to be done about 3-5 days before at the [facility] (so this is for TWO appointments - please see the scheduling desk as you leave)

## 2022-10-11 NOTE — Assessment & Plan Note (Signed)
Last vitamin D Lab Results  Component Value Date   VD25OH 19.79 (L) 01/30/2022   Low, to start oral replacement

## 2022-10-11 NOTE — Progress Notes (Signed)
Patient ID: Ellen Fernandez, female   DOB: August 05, 1971, 52 y.o.   MRN: [id number]         Chief Complaint:: wellness exam and vertigo, eye allergy symtpoms, obesity, hld, htn, low vit d       HPI:  Ellen Fernandez is a 52 y.o. female here for wellness exam; declines covid booster, flu shot, but ok for shingrix at pharmacy, and colonoscopy, o/w up to date                        Also has had mild intermittent veritgo positional in the past 2-3 wks, seconds lasting only, without ha, fever, ear pain, reduced hearing.  Does have several wks ongoing eye and nasal allergy symptoms with clearish congestion, itch and sneezing, without fever, pain, ST, cough, swelling or wheezing.  Has not been able to lose wt with diet and exercise, and wegovy has been on backorder at pharmacy.  Pt denies chest pain, increased sob or doe, wheezing, orthopnea, PND, increased LE swelling, palpitations, dizziness or syncope.   Pt denies polydipsia, polyuria, or new focal neuro s/s.    Pt denies fever, wt loss, night sweats, loss of appetite, or other constitutional symptoms     Wt Readings from Last 3 Encounters:  10/11/22 214 lb (97.1 kg)  02/01/22 213 lb (96.6 kg)  02/21/21 205 lb (93 kg)   BP Readings from Last 3 Encounters:  10/11/22 126/74  02/01/22 126/80  03/16/21 118/80   Immunization History  Administered Date(s) Administered   Td 09/10/2005   Tdap 02/01/2016  There are no preventive care reminders to display for this patient.    Past Medical History:  Diagnosis Date   Asthma    GERD (gastroesophageal reflux disease)    HTN (hypertension)    Hyperlipidemia    Past Surgical History:  Procedure Laterality Date   ABDOMINAL HYSTERECTOMY     partial small bowel resection  2671   complication of umbilical hernia repair   TUBAL LIGATION     UMBILICAL HERNIA REPAIR      reports that she has never smoked. She has never used smokeless tobacco. She reports that she does not currently use alcohol. She reports that  she does not use drugs. family history includes Cancer in her father; Hypertension in her mother; Rashes / Skin problems in her daughter; Rheum arthritis in her daughter. Allergies  Allergen Reactions   Crestor [Rosuvastatin] Other (See Comments)    Joint pain   Lipitor [Atorvastatin] Other (See Comments)    Muscle pain   Sulfonamide Derivatives Hives   Current Outpatient Medications on File Prior to Visit  Medication Sig Dispense Refill   albuterol (VENTOLIN HFA) 108 (90 Base) MCG/ACT inhaler Inhale 2 puffs into the lungs every 6 (six) hours as needed for wheezing or shortness of breath. 18 g 11   amLODipine (NORVASC) 10 MG tablet Take 1 tablet (10 mg total) by mouth daily. 90 tablet 1   fluticasone (FLONASE) 50 MCG/ACT nasal spray one spray by Both Nostrils route daily. 16 g 11   hydrochlorothiazide (MICROZIDE) 12.5 MG capsule TAKE 1 CAPSULE(12.5 MG) BY MOUTH DAILY 90 capsule 1   ibuprofen (ADVIL,MOTRIN) 800 MG tablet Take 1 tablet (800 mg total) by mouth daily as needed for moderate pain. 90 tablet 3   meloxicam (MOBIC) 15 MG tablet TAKE 1 TABLET BY MOUTH EVERY DAY 90 tablet 1   omeprazole (PRILOSEC) 40 MG capsule TAKE 1 CAPSULE(40 MG) BY MOUTH DAILY  90 capsule 1   ondansetron (ZOFRAN) 4 MG tablet Take 1 tablet (4 mg total) by mouth every 8 (eight) hours as needed for nausea or vomiting. 40 tablet 0   Plecanatide (TRULANCE) 3 MG TABS 1 tab by mouth once daily as needed 90 tablet 3   potassium chloride (KLOR-CON M) 10 MEQ tablet Take 2 tablets (20 mEq total) by mouth daily. 180 tablet 3   Semaglutide-Weight Management (WEGOVY) 0.5 MG/0.5ML SOAJ Inject 0.5 mg into the skin once a week. 2 mL 11   zolpidem (AMBIEN) 10 MG tablet TAKE 1 TABLET(10 MG) BY MOUTH AT BEDTIME AS NEEDED FOR SLEEP 90 tablet 1   No current facility-administered medications on file prior to visit.        ROS:  All others reviewed and negative.  Objective        PE:  BP 126/74 (BP Location: Right Arm, Patient  Position: Sitting, Cuff Size: Large)   Pulse 98   Temp 98.2 F (36.8 C) (Oral)   Ht 5\' 2"  (1.575 m)   Wt 214 lb (97.1 kg)   SpO2 94%   BMI 39.14 kg/m                 Constitutional: Pt appears in NAD               HENT: Head: NCAT.                Right Ear: External ear normal.                 Left Ear: External ear normal.                Eyes: . Pupils are equal, round, and reactive to light. Conjunctivae and EOM are normal               Nose: without d/c or deformity               Neck: Neck supple. Gross normal ROM               Cardiovascular: Normal rate and regular rhythm.                 Pulmonary/Chest: Effort normal and breath sounds without rales or wheezing.                Abd:  Soft, NT, ND, + BS, no organomegaly               Neurological: Pt is alert. At baseline orientation, motor grossly intact               Skin: Skin is warm. No rashes, no other new lesions, LE edema - none               Psychiatric: Pt behavior is normal without agitation   Micro: none  Cardiac tracings I have personally interpreted today:  none  Pertinent Radiological findings (summarize): none   Lab Results  Component Value Date   WBC 10.0 01/30/2022   HGB 14.4 01/30/2022   HCT 43.0 01/30/2022   PLT 323.0 01/30/2022   GLUCOSE 81 01/30/2022   CHOL 304 (H) 01/30/2022   TRIG 141.0 01/30/2022   HDL 70.10 01/30/2022   LDLCALC 206 (H) 01/30/2022   ALT 19 01/30/2022   AST 20 01/30/2022   NA 136 01/30/2022   K 3.4 (L) 01/30/2022   CL 96 01/30/2022   CREATININE 0.85 01/30/2022   BUN 20 01/30/2022  CO2 29 01/30/2022   TSH 2.61 01/30/2022   Assessment/Plan:  Ellen Fernandez is a 52 y.o. Black or African American [2] female with  has a past medical history of Asthma, GERD (gastroesophageal reflux disease), HTN (hypertension), and Hyperlipidemia.  Encounter for well adult exam with abnormal findings Age and sex appropriate education and counseling updated with regular exercise and  diet Referrals for preventative services - for colonoscopy Immunizations addressed - declines covid booster, flu shot, but ok for shingrix at pharmacy Smoking counseling  - none needed Evidence for depression or other mood disorder - none significant Most recent labs reviewed. I have personally reviewed and have noted: 1) the patient's medical and social history 2) The patient's current medications and supplements 3) The patient's height, weight, and BMI have been recorded in the chart   HLD (hyperlipidemia) Lab Results  Component Value Date   LDLCALC 206 (H) 01/30/2022   Very severe elev ldl, to continue low chol diet, , pt not been able to tolerate statins, for f/u lipids, consider add zetia   HTN (hypertension) BP Readings from Last 3 Encounters:  10/11/22 126/74  02/01/22 126/80  03/16/21 118/80   Stable, pt to continue medical treatment norvasc 10 mg qd, hct 12.5 mg qd   Obesity (BMI 30.0-34.9) Unable to get wegovy due to backorder, ok to try change to zepbound 2.5 mg weekly  Vitamin D deficiency Last vitamin D Lab Results  Component Value Date   VD25OH 19.79 (L) 01/30/2022   Low, to start oral replacement   Allergic rhinitis Uncontrolled, for restart allegra and nasacort, also add otc zaditor to eyes  Vertigo Mild recurrent, likely left middle ear related, ok for otc dramamine prn Followup: Return in about 1 year (around 10/12/2023).  Cathlean Cower, MD 10/11/2022 9:56 AM [facility]

## 2022-10-11 NOTE — Assessment & Plan Note (Signed)
Mild recurrent, likely left middle ear related, ok for otc dramamine prn

## 2022-10-16 ENCOUNTER — Telehealth: Payer: Self-pay | Admitting: Internal Medicine

## 2022-10-16 NOTE — Telephone Encounter (Signed)
Sorry, pt does not have DM, so mounjaro will not be approved by the insurnace

## 2022-10-16 NOTE — Telephone Encounter (Signed)
Patient called stating that the following medication needs a Prior Authorization ezetimibe (ZETIA) 10 MG tablet. Patient stated that her insurance does not cover tirzepatide (ZEPBOUND) 2.5 MG/0.5ML Pen. But they will cover Mounjaro. This medication will also need a Prior Authorization. Best callback number for patient is [phone].

## 2022-10-16 NOTE — Telephone Encounter (Signed)
PA for zetia had to call [phone] gave info to rep. Med was Approved w/ ID [id number]. Effective 10/16/22-10/17/23. Notified pt of status.Marland KitchenJohny Fernandez

## 2022-10-16 NOTE — Telephone Encounter (Signed)
Pls advise on Mounjaro.. Will start PA for Zetia./lm,b

## 2022-10-19 NOTE — Progress Notes (Unsigned)
I, Peterson Lombard, LAT, ATC acting as a scribe for Lynne Leader, MD.  Subjective:    CC: Right knee pain  HPI: Patient is a 52 year old female presenting with right knee pain ongoing for about 2 weeks. Pt had a rotational type mechanism, w/ a pivot type motion when she first felt the pain.  Pt is only having pain when pivoting/turning and then trying to bear weight through the R knee.   R Knee swelling: no Mechanical symptoms: no Radiates: no Aggravates: pivoting motions, walking Treatments tried: elevation, compression sleeve  Pertinent review of Systems: No fevers or chills  Relevant historical information: Hypertension.  History of small bowel obstruction.   Objective:    Vitals:   10/24/22 1057 10/24/22 1102  BP: (!) 146/92 (!) 146/92  Pulse: 99   SpO2: 97%    General: Well Developed, well nourished, and in no acute distress.   MSK: Right knee: Minimal effusion otherwise normal-appearing Normal motion with crepitation. Tender palpation medial joint line. Stable ligamentous exam. Positive McMurray's test. Intact strength.  Lab and Radiology Results  Procedure: Real-time Ultrasound Guided Injection of right knee superior lateral patellar space Device: Philips Affiniti 50G Images permanently stored and available for review in PACS Verbal informed consent obtained.  Discussed risks and benefits of procedure. Warned about infection, bleeding, hyperglycemia damage to structures among others. Patient expresses understanding and agreement Time-out conducted.   Noted no overlying erythema, induration, or other signs of local infection.   Skin prepped in a sterile fashion.   Local anesthesia: Topical Ethyl chloride.   With sterile technique and under real time ultrasound guidance: 40 mg of Kenalog and 2 mL Marcaine injected into knee joint. Fluid seen entering the joint capsule.   Completed without difficulty   Pain immediately resolved suggesting accurate placement  of the medication.   Advised to call if fevers/chills, erythema, induration, drainage, or persistent bleeding.   Images permanently stored and available for review in the ultrasound unit.  Impression: Technically successful ultrasound guided injection.    X-ray images right knee obtained today personally and independently interpreted High riding patella.  Mild patellofemoral DJD.  Mild medial DJD.  No acute fractures. Await formal radiology review   Impression and Recommendations:    Assessment and Plan: 52 y.o. female with right acute knee pain.  Pain thought to be due to degenerative medial meniscus tear.  Plan for steroid injection.  Recheck in about 6 weeks.  If not improved consider MRI to further evaluate source of pain.  Patient does have a positive McMurray test.. We talked about medication safety doses of Tylenol and ibuprofen.  Also recommend Voltaren gel.  PDMP not reviewed this encounter. Orders Placed This Encounter  Procedures   [place] LIMITED JOINT SPACE STRUCTURES LOW RIGHT(NO LINKED CHARGES)    Order Specific Question:   Reason for Exam (SYMPTOM  OR DIAGNOSIS REQUIRED)    Answer:   Right knee pain    Order Specific Question:   Preferred imaging location?    Answer:   [facility] [place]   DG Knee AP/LAT W/Sun[place] Right    Standing Status:   Future    Number of Occurrences:   1    Standing Expiration Date:   11/17/2022    Order Specific Question:   Reason for Exam (SYMPTOM  OR DIAGNOSIS REQUIRED)    Answer:   Right knee pain    Order Specific Question:   Preferred imaging location?    Answer:   [facility]    Order Specific Question:   Is patient pregnant?    Answer:   No   No orders of the defined types were placed in this encounter.   Discussed warning signs or symptoms. Please see discharge instructions. Patient expresses understanding.   The above documentation has been reviewed and is accurate and complete Lynne Leader, M.D.

## 2022-10-24 ENCOUNTER — Ambulatory Visit: Payer: Self-pay

## 2022-10-24 ENCOUNTER — Ambulatory Visit (INDEPENDENT_AMBULATORY_CARE_PROVIDER_SITE_OTHER): Payer: BC Managed Care – PPO

## 2022-10-24 ENCOUNTER — Ambulatory Visit (INDEPENDENT_AMBULATORY_CARE_PROVIDER_SITE_OTHER): Payer: BC Managed Care – PPO | Admitting: Family Medicine

## 2022-10-24 VITALS — BP 146/92 | HR 99 | Ht 62.0 in | Wt 214.0 lb

## 2022-10-24 DIAGNOSIS — M25561 Pain in right knee: Secondary | ICD-10-CM | POA: Diagnosis not present

## 2022-10-24 DIAGNOSIS — G8929 Other chronic pain: Secondary | ICD-10-CM

## 2022-10-24 NOTE — Patient Instructions (Addendum)
Thank you for coming in today.   You received an injection today. Seek immediate medical attention if the joint becomes red, extremely painful, or is oozing fluid.   Please get an Xray today before you leave   Please use Voltaren gel (Generic Diclofenac Gel) up to 4x daily for pain as needed.  This is available over-the-counter as both the name brand Voltaren gel and the generic diclofenac gel.   Check back in 6 weeks

## 2022-10-29 NOTE — Progress Notes (Signed)
Right knee x-ray shows some mild arthritis

## 2022-11-05 ENCOUNTER — Telehealth: Payer: Self-pay

## 2022-11-05 NOTE — Telephone Encounter (Signed)
Pt PA for Ezetimibe is not needed  Key: [id number]

## 2022-11-30 ENCOUNTER — Other Ambulatory Visit (HOSPITAL_COMMUNITY): Payer: Self-pay

## 2022-12-03 ENCOUNTER — Other Ambulatory Visit: Payer: Self-pay | Admitting: Internal Medicine

## 2022-12-12 ENCOUNTER — Ambulatory Visit: Payer: BC Managed Care – PPO | Admitting: Family Medicine

## 2022-12-24 ENCOUNTER — Other Ambulatory Visit: Payer: Self-pay | Admitting: Internal Medicine

## 2022-12-27 ENCOUNTER — Other Ambulatory Visit: Payer: Self-pay | Admitting: Internal Medicine

## 2023-01-01 ENCOUNTER — Other Ambulatory Visit: Payer: Self-pay | Admitting: Internal Medicine

## 2023-01-03 ENCOUNTER — Other Ambulatory Visit: Payer: Self-pay

## 2023-01-03 MED ORDER — HYDROCHLOROTHIAZIDE 12.5 MG PO CAPS: ORAL_CAPSULE | ORAL | 1 refills | Status: DC

## 2023-01-03 MED ORDER — AMLODIPINE BESYLATE 10 MG PO TABS: 10.0000 mg | ORAL_TABLET | Freq: Every day | ORAL | 1 refills | Status: DC

## 2023-01-14 ENCOUNTER — Ambulatory Visit (INDEPENDENT_AMBULATORY_CARE_PROVIDER_SITE_OTHER): Payer: BC Managed Care – PPO | Admitting: Student

## 2023-01-14 ENCOUNTER — Ambulatory Visit (INDEPENDENT_AMBULATORY_CARE_PROVIDER_SITE_OTHER): Payer: BC Managed Care – PPO

## 2023-01-14 DIAGNOSIS — S82862A Displaced Maisonneuve's fracture of left leg, initial encounter for closed fracture: Secondary | ICD-10-CM

## 2023-01-14 DIAGNOSIS — S82861A Displaced Maisonneuve's fracture of right leg, initial encounter for closed fracture: Secondary | ICD-10-CM | POA: Diagnosis not present

## 2023-01-14 DIAGNOSIS — M25562 Pain in left knee: Secondary | ICD-10-CM | POA: Diagnosis not present

## 2023-01-14 DIAGNOSIS — M25572 Pain in left ankle and joints of left foot: Secondary | ICD-10-CM | POA: Diagnosis not present

## 2023-01-14 NOTE — Progress Notes (Addendum)
Chief Complaint: Left knee/leg and ankle pain     History of Present Illness:    Ellen Fernandez is a 52 y.o. female presenting in her left ankle and knee area after a fall yesterday.  She was walking down the stairs in a movie theater when she twisted her ankle and subsequently fell down 3 steps and onto her knee/shin.  She has had difficulty weightbearing.  Most of her pain is in the lateral ankle and around the front of the knee below the kneecap.  She states that her pain is severe and has had little relief with ibuprofen.  No previous history of injury to this knee or ankle.  Denies any numbness or tingling.  She currently works from home.   Surgical History:   None  PMH/PSH/Family History/Social History/Meds/Allergies:    Past Medical History:  Diagnosis Date   Asthma    GERD (gastroesophageal reflux disease)    HTN (hypertension)    Hyperlipidemia    Past Surgical History:  Procedure Laterality Date   ABDOMINAL HYSTERECTOMY     partial small bowel resection  2017   complication of umbilical hernia repair   TUBAL LIGATION     UMBILICAL HERNIA REPAIR     Social History   Socioeconomic History   Marital status: Single    Spouse name: Not on file   Number of children: Not on file   Years of education: Not on file   Highest education level: Not on file  Occupational History   Occupation: customer service  Tobacco Use   Smoking status: Never   Smokeless tobacco: Never  Vaping Use   Vaping Use: Never used  Substance and Sexual Activity   Alcohol use: Not Currently   Drug use: No   Sexual activity: Yes    Birth control/protection: Surgical    Comment: Hyst  Other Topics Concern   Not on file  Social History Narrative   Not on file   Social Determinants of Health   Financial Resource Strain: Not on file  Food Insecurity: Not on file  Transportation Needs: Not on file  Physical Activity: Not on file  Stress: Not on file   Social Connections: Not on file   Family History  Problem Relation Age of Onset   Hypertension Mother    Cancer Father    Rashes / Skin problems Daughter    Rheum arthritis Daughter    Allergies  Allergen Reactions   Crestor [Rosuvastatin] Other (See Comments)    Joint pain   Lipitor [Atorvastatin] Other (See Comments)    Muscle pain   Sulfonamide Derivatives Hives   Current Outpatient Medications  Medication Sig Dispense Refill   ezetimibe (ZETIA) 10 MG tablet Take 1 tablet (10 mg total) by mouth daily. 90 tablet 3   albuterol (VENTOLIN HFA) 108 (90 Base) MCG/ACT inhaler Inhale 2 puffs into the lungs every 6 (six) hours as needed for wheezing or shortness of breath. 18 g 11   amLODipine (NORVASC) 10 MG tablet Take 1 tablet (10 mg total) by mouth daily. 90 tablet 1   fluticasone (FLONASE) 50 MCG/ACT nasal spray one spray by Both Nostrils route daily. 16 g 11   hydrochlorothiazide (MICROZIDE) 12.5 MG capsule TAKE 1 CAPSULE(12.5 MG) BY MOUTH DAILY 90 capsule 1   ibuprofen (ADVIL,MOTRIN) 800 MG tablet  Take 1 tablet (800 mg total) by mouth daily as needed for moderate pain. 90 tablet 3   meloxicam (MOBIC) 15 MG tablet TAKE 1 TABLET BY MOUTH EVERY DAY 90 tablet 1   omeprazole (PRILOSEC) 40 MG capsule TAKE 1 CAPSULE BY MOUTH EVERY DAY 90 capsule 1   ondansetron (ZOFRAN) 4 MG tablet Take 1 tablet (4 mg total) by mouth every 8 (eight) hours as needed for nausea or vomiting. (Patient not taking: Reported on 10/24/2022) 40 tablet 0   Plecanatide (TRULANCE) 3 MG TABS 1 tab by mouth once daily as needed 90 tablet 3   potassium chloride (KLOR-CON M) 10 MEQ tablet Take 2 tablets (20 mEq total) by mouth daily. 180 tablet 3   Semaglutide-Weight Management (WEGOVY) 0.5 MG/0.5ML SOAJ Inject 0.5 mg into the skin once a week. 2 mL 11   zolpidem (AMBIEN) 10 MG tablet TAKE 1 TABLET(10 MG) BY MOUTH AT BEDTIME AS NEEDED FOR SLEEP 90 tablet 1   No current facility-administered medications for this visit.    No results found.  Review of Systems:   A ROS was performed including pertinent positives and negatives as documented in the HPI.  Physical Exam :   Constitutional: NAD and appears stated age Neurological: Alert and oriented Psych: Appropriate affect and cooperative There were no vitals taken for this visit.   Comprehensive Musculoskeletal Exam:    Patient ambulating with antalgic gait without assistive device. Mild swelling of the distal lower leg and lateral ankle. Tenderness to palpation over lateral malleolus and anterior distal third of the tib/fib. Active ankle ROM to 10 degrees dorsiflexion and 30 degrees plantarflexion with discomfort. DP/TP pulses 2+.  Intact sensation to the foot. Left knee is tender over patellar, patellar tendon, and proximal tib/fib. The is notable edema over the anterior leg just distal to the knee. Active knee ROM 0-120 degrees with normal strength. No joint line tenderness.  Imaging:   Xray (left ankle 3 views, left knee 4 views): Nondisplaced fracture of distal third of fibula. Maisonneuve fracture of proximal fibula with no displacement.   I personally reviewed and interpreted the radiographs.   Assessment:   52 y.o. female with left knee and ankle pain after a fall yesterday.  She does have a nondisplaced proximal fibula fracture as well as a likely distal fibula fracture that is also nondisplaced.  I would like to start by putting her in a walking boot and progressing weightbearing as tolerated.  Patient does have some meloxicam at home that she takes as needed and we discussed that this with additional Tylenol as needed should help with pain control.  Can send more meloxicam and if needed.  Will plan to follow-up with her in 4 weeks for reassessment.  Plan :    - Return to clinic in 4 weeks     I personally saw and evaluated the patient, and participated in the management and treatment plan.  Hazle Nordmann, PA-C Orthopedics  This  document was dictated using Conservation officer, historic buildings. A reasonable attempt at proof reading has been made to minimize errors.

## 2023-01-14 NOTE — Progress Notes (Signed)
She was coming out of the movies and fell going down the steps about 3 steps.  She twisted her ankle and fell on her shin/ knee.  Pain in under the knee cap and lateral side of her ankle.  She is taking ibuprofen for pain not helping much.  Unable to put weight on leg/ ankle without pain.  When she moves her leg to the left she has pain.  Ankle is tender to touch. When touching ankle it brought tears to her eyes due to the pain.

## 2023-01-17 ENCOUNTER — Telehealth: Payer: Self-pay | Admitting: Student

## 2023-01-17 NOTE — Telephone Encounter (Signed)
Patient called in stating her OV notes have changed or were edited and would like to know why and gain clarification to what is going on with her health please advise

## 2023-01-17 NOTE — Telephone Encounter (Signed)
I talked to the pt. She would like for you to call her. She stated her xray results say no fracture but you told her she had one

## 2023-01-17 NOTE — Telephone Encounter (Signed)
Spoke with patient and discussed that the radiology read did not pick up on the fracture however nothing has changed with treatment and will continue as planned.  She did say that she will be bringing paperwork by soon in order to receive more accommodations at work.

## 2023-01-18 ENCOUNTER — Other Ambulatory Visit: Payer: Self-pay

## 2023-01-18 MED ORDER — POTASSIUM CHLORIDE ER 10 MEQ PO TBCR: 20.0000 meq | EXTENDED_RELEASE_TABLET | Freq: Every day | ORAL | 1 refills | Status: DC

## 2023-02-01 ENCOUNTER — Other Ambulatory Visit: Payer: Managed Care, Other (non HMO)

## 2023-02-06 ENCOUNTER — Encounter: Payer: Managed Care, Other (non HMO) | Admitting: Internal Medicine

## 2023-02-06 ENCOUNTER — Other Ambulatory Visit: Payer: Self-pay | Admitting: Internal Medicine

## 2023-02-12 ENCOUNTER — Ambulatory Visit (HOSPITAL_BASED_OUTPATIENT_CLINIC_OR_DEPARTMENT_OTHER): Payer: BC Managed Care – PPO

## 2023-02-12 ENCOUNTER — Ambulatory Visit (INDEPENDENT_AMBULATORY_CARE_PROVIDER_SITE_OTHER): Payer: BC Managed Care – PPO | Admitting: Student

## 2023-02-12 ENCOUNTER — Other Ambulatory Visit (HOSPITAL_BASED_OUTPATIENT_CLINIC_OR_DEPARTMENT_OTHER): Payer: Self-pay | Admitting: Student

## 2023-02-12 DIAGNOSIS — S82861A Displaced Maisonneuve's fracture of right leg, initial encounter for closed fracture: Secondary | ICD-10-CM

## 2023-02-12 DIAGNOSIS — M1712 Unilateral primary osteoarthritis, left knee: Secondary | ICD-10-CM | POA: Diagnosis not present

## 2023-02-12 DIAGNOSIS — S82864D Nondisplaced Maisonneuve's fracture of right leg, subsequent encounter for closed fracture with routine healing: Secondary | ICD-10-CM

## 2023-02-12 DIAGNOSIS — S82865D Nondisplaced Maisonneuve's fracture of left leg, subsequent encounter for closed fracture with routine healing: Secondary | ICD-10-CM

## 2023-02-12 DIAGNOSIS — M19072 Primary osteoarthritis, left ankle and foot: Secondary | ICD-10-CM | POA: Diagnosis not present

## 2023-02-12 NOTE — Progress Notes (Addendum)
Chief Complaint: Left knee/leg and ankle pain     History of Present Illness:   02/12/23:  Patient presents today for 4 week follow up status post left maisonneuve fracture.  She does report some continued pain and swelling, particularly at the ankle.  She has been wearing the walking boot.  Pain gets worse at night and with prolonged standing which cause it to throb.  Pain an 8/10 at its worst.  She has been taking meloxicam and tylenol for pain control.  Does also note some swelling below the knee and that the knee itself feels tight.   01/14/2023: Ellen Fernandez is a 52 y.o. female presenting in her left ankle and knee area after a fall yesterday.  She was walking [place] the stairs in a movie theater when she twisted her ankle and subsequently fell down 3 steps and onto her knee/shin.  She has had difficulty weightbearing.  Most of her pain is in the lateral ankle and around the front of the knee below the kneecap.  She states that her pain is severe and has had little relief with ibuprofen.  No previous history of injury to this knee or ankle.  Denies any numbness or tingling.  She currently works from home.   Surgical History:   None  PMH/PSH/Family History/Social History/Meds/Allergies:    Past Medical History:  Diagnosis Date   Asthma    GERD (gastroesophageal reflux disease)    HTN (hypertension)    Hyperlipidemia    Past Surgical History:  Procedure Laterality Date   ABDOMINAL HYSTERECTOMY     partial small bowel resection  2017   complication of umbilical hernia repair   TUBAL LIGATION     UMBILICAL HERNIA REPAIR     Social History   Socioeconomic History   Marital status: Single    Spouse name: Not on file   Number of children: Not on file   Years of education: Not on file   Highest education level: Not on file  Occupational History   Occupation: customer service  Tobacco Use   Smoking status: Never   Smokeless tobacco: Never   Vaping Use   Vaping Use: Never used  Substance and Sexual Activity   Alcohol use: Not Currently   Drug use: No   Sexual activity: Yes    Birth control/protection: Surgical    Comment: Hyst  Other Topics Concern   Not on file  Social History Narrative   Not on file   Social Determinants of Health   Financial Resource Strain: Not on file  Food Insecurity: Not on file  Transportation Needs: Not on file  Physical Activity: Not on file  Stress: Not on file  Social Connections: Not on file   Family History  Problem Relation Age of Onset   Hypertension Mother    Cancer Father    Rashes / Skin problems Daughter    Rheum arthritis Daughter    Allergies  Allergen Reactions   Crestor [Rosuvastatin] Other (See Comments)    Joint pain   Lipitor [Atorvastatin] Other (See Comments)    Muscle pain   Sulfonamide Derivatives Hives   Current Outpatient Medications  Medication Sig Dispense Refill   ezetimibe (ZETIA) 10 MG tablet Take 1 tablet (10 mg total) by mouth daily. 90 tablet 3   albuterol (VENTOLIN HFA)  108 (90 Base) MCG/ACT inhaler Inhale 2 puffs into the lungs every 6 (six) hours as needed for wheezing or shortness of breath. 18 g 11   amLODipine (NORVASC) 10 MG tablet Take 1 tablet (10 mg total) by mouth daily. 90 tablet 1   fluticasone (FLONASE) 50 MCG/ACT nasal spray one spray by Both Nostrils route daily. 16 g 11   hydrochlorothiazide (MICROZIDE) 12.5 MG capsule TAKE 1 CAPSULE(12.5 MG) BY MOUTH DAILY 90 capsule 1   ibuprofen (ADVIL,MOTRIN) 800 MG tablet Take 1 tablet (800 mg total) by mouth daily as needed for moderate pain. 90 tablet 3   meloxicam (MOBIC) 15 MG tablet TAKE 1 TABLET BY MOUTH EVERY DAY 90 tablet 1   omeprazole (PRILOSEC) 40 MG capsule TAKE 1 CAPSULE BY MOUTH EVERY DAY 90 capsule 1   ondansetron (ZOFRAN) 4 MG tablet Take 1 tablet (4 mg total) by mouth every 8 (eight) hours as needed for nausea or vomiting. (Patient not taking: Reported on 10/24/2022) 40 tablet  0   Plecanatide (TRULANCE) 3 MG TABS 1 tab by mouth once daily as needed 90 tablet 3   potassium chloride (KLOR-CON M) 10 MEQ tablet Take 2 tablets (20 mEq total) by mouth daily. 180 tablet 3   potassium chloride (KLOR-CON) 10 MEQ tablet Take 2 tablets (20 mEq total) by mouth daily. 180 tablet 1   Semaglutide-Weight Management (WEGOVY) 0.5 MG/0.5ML SOAJ Inject 0.5 mg into the skin once a week. 2 mL 11   zolpidem (AMBIEN) 10 MG tablet TAKE 1 TABLET(10 MG) BY MOUTH AT BEDTIME AS NEEDED FOR SLEEP 90 tablet 1   No current facility-administered medications for this visit.   No results found.  Review of Systems:   A ROS was performed including pertinent positives and negatives as documented in the HPI.  Physical Exam :   Constitutional: NAD and appears stated age Neurological: Alert and oriented Psych: Appropriate affect and cooperative There were no vitals taken for this visit.   Comprehensive Musculoskeletal Exam:    Patient is ambulating and weight-bearing with use of walking boot.  Left ankle is swollen with tenderness over the AITFL, ATFL, and calcaneofibular ligaments.  Active ankle ROM to 10 degrees dorsiflexion and 30 degrees plantarflexion.  Negative anterior drawer.  DP and PT pulses 2+ bilaterally.  There is swelling just distal to the left knee with tenderness over the proximal fibula.  No knee joint line tenderness.  Patella is tender to palpation.  No laxity with varus or valgus stress and negative Lachman.  Active knee ROM from 10 to 120 degrees.   Imaging:   Xray (left ankle 3 views, left knee 4 views): Oblique nondisplaced proximal fibular shaft fracture with early signs of healing.  Soft tissue swelling of the ankle.   I personally reviewed and interpreted the radiographs.   Assessment:   52 y.o. female 4 weeks status post left maisonneuve fracture after sustaining a fall down stairs.  Ankle x-rays are well-appearing, with possible AITFL avulsion that is healing in  well. At this point I believe the pain and swelling of the ankle are more a result of the ligamentous injuries.  Recommend to continue wearing the boot as needed for weight bearing and ambulation, however to continue moving the ankle while out of the boot to preserve motion.  The small proximal fibula fractures have progressed into a larger oblique fracture, however this remains nondisplaced and does appear to be in early stages of healing so I do believe this can be managed  conservatively as planned.  I would like to have her return for reassessment in 3-4 to ensure no further fracture displacement.  Can consider adding on physical therapy at that time for the ankle depending on progress.   Plan :    - Return to clinic in 3-4 weeks for repeat knee/fibula xray and reassessment     I personally saw and evaluated the patient, and participated in the management and treatment plan.  Hazle Nordmann, PA-C Orthopedics  This document was dictated using Conservation officer, historic buildings. A reasonable attempt at proof reading has been made to minimize errors.

## 2023-02-13 IMAGING — CT CT RENAL STONE PROTOCOL
1 of 2 series · 12 of 32 positions shown, 16 images · non-contrast
Comparison: None.
COMPARISON: None.

Addendum:
CLINICAL DATA: Left flank pain for 1 month

EXAM:
CT ABDOMEN AND PELVIS WITHOUT CONTRAST
TECHNIQUE: Multidetector CT imaging of the abdomen and pelvis was performed
following the standard protocol without IV contrast.

[Series 2: renal standard/full · axial · 0.79mm/px · z∈[-385,+45]mm · 12 of 96 slices shown, 16 images]
[im 5/96  soft-tissue]
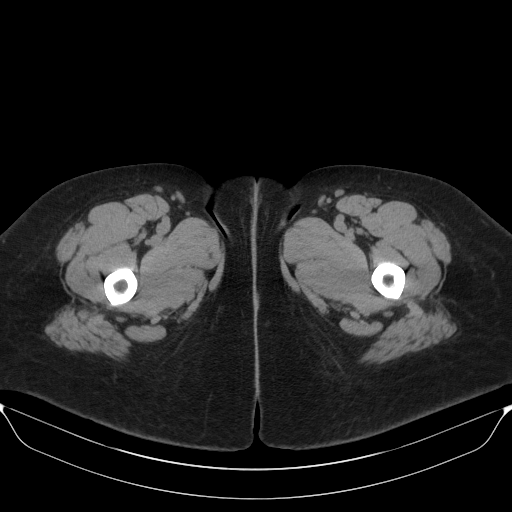
[im 5/96  bone]
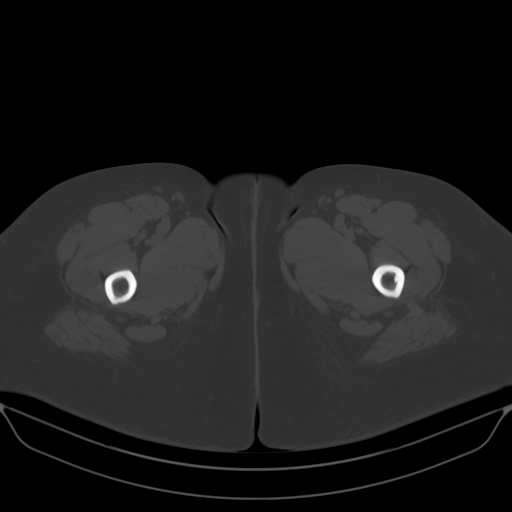
[im 15/96  soft-tissue]
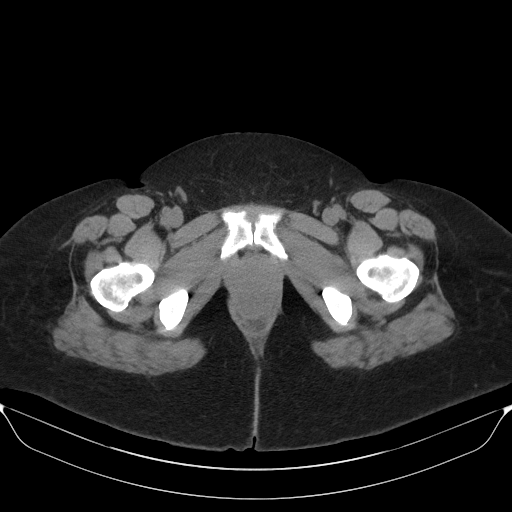
[im 24/96  soft-tissue]
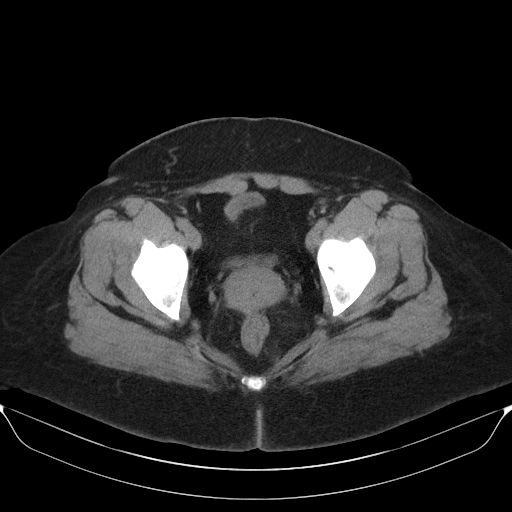
[im 34/96  soft-tissue]
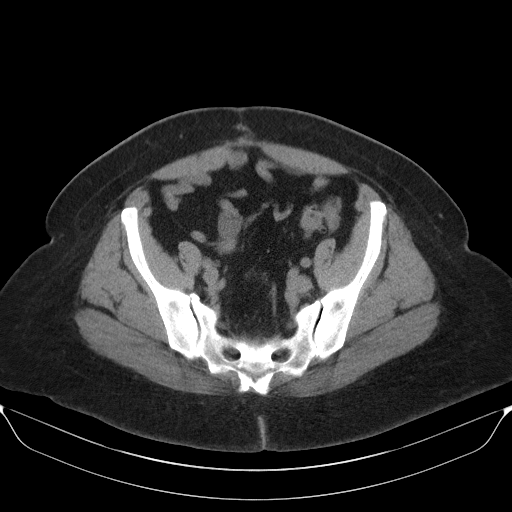
[im 43/96  soft-tissue]
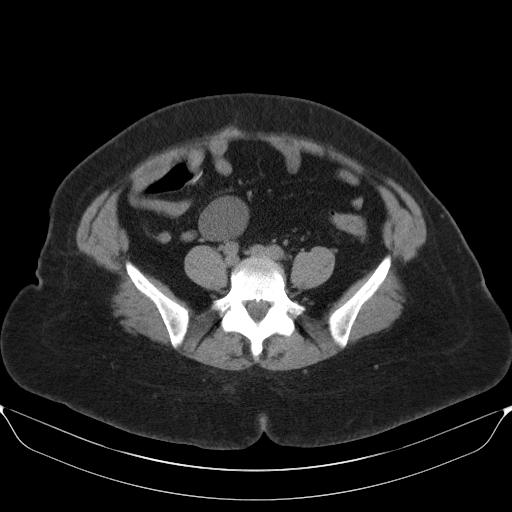
[im 53/96  soft-tissue]
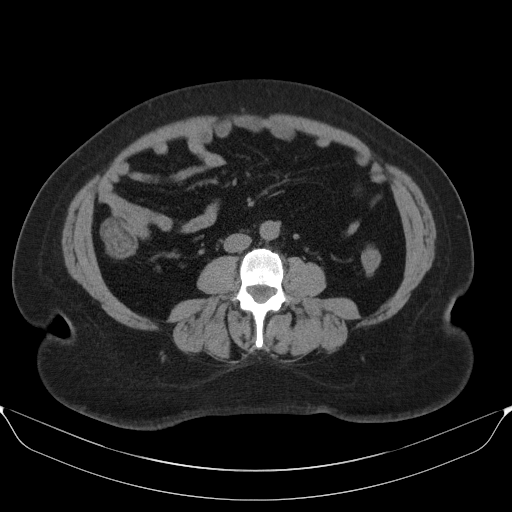
[im 62/96  soft-tissue]
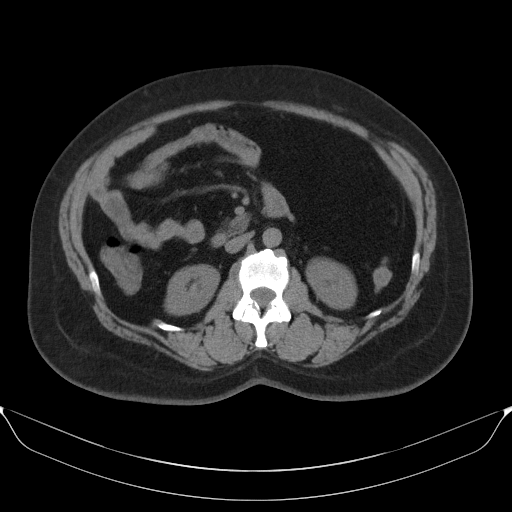
[im 72/96  soft-tissue]
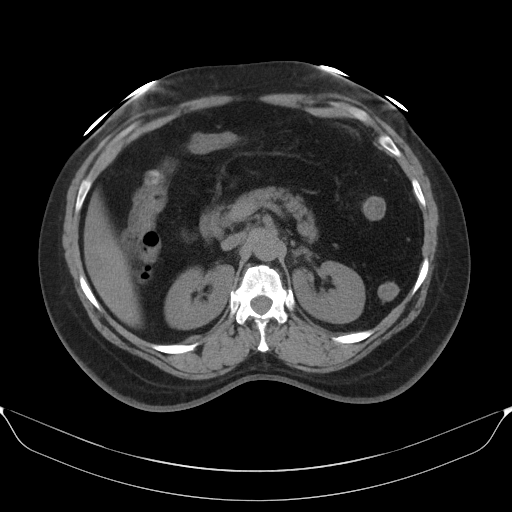
[im 77/96  lung]
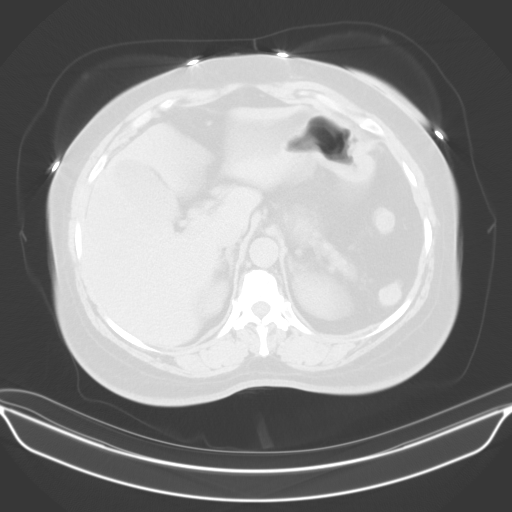
[im 81/96  soft-tissue]
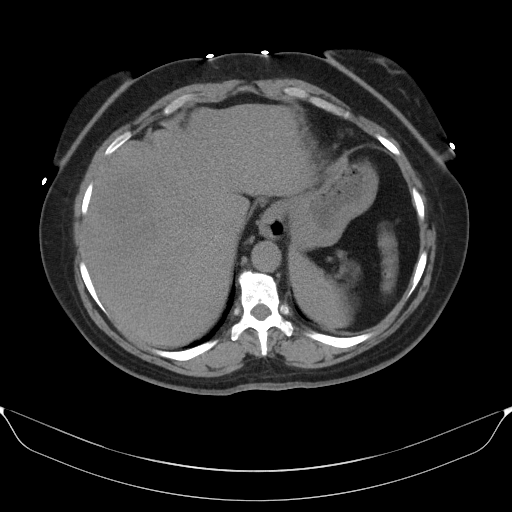
[im 81/96  lung]
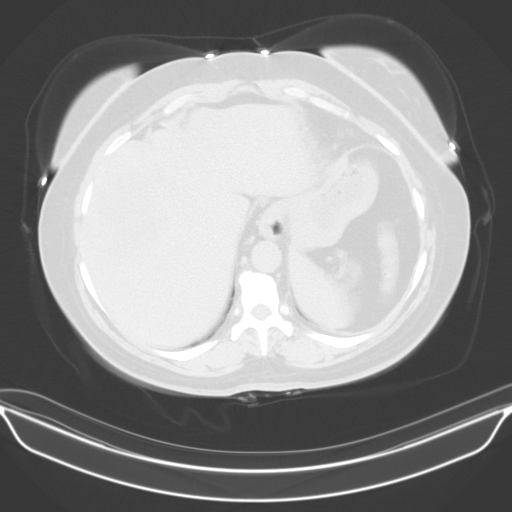
[im 81/96  bone]
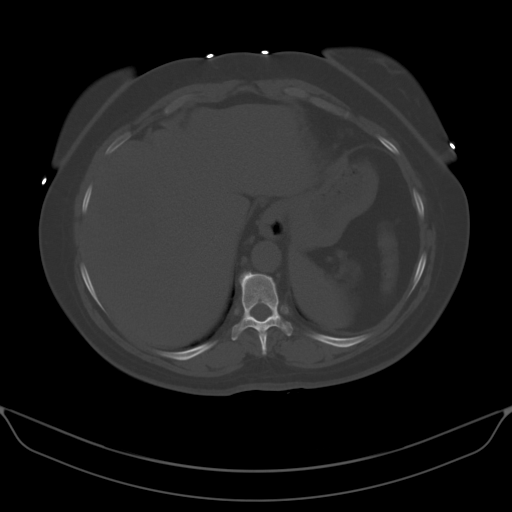
[im 86/96  lung]
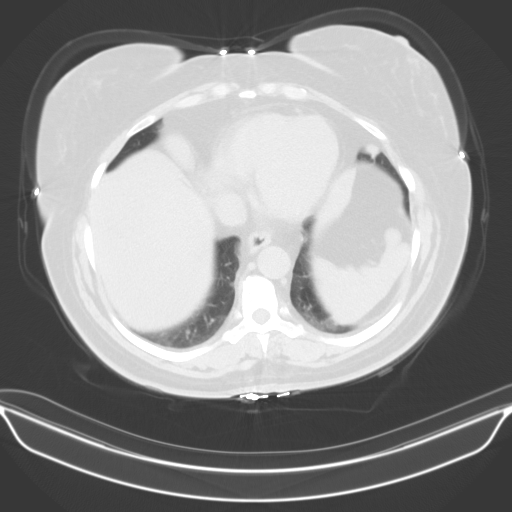
[im 91/96  soft-tissue]
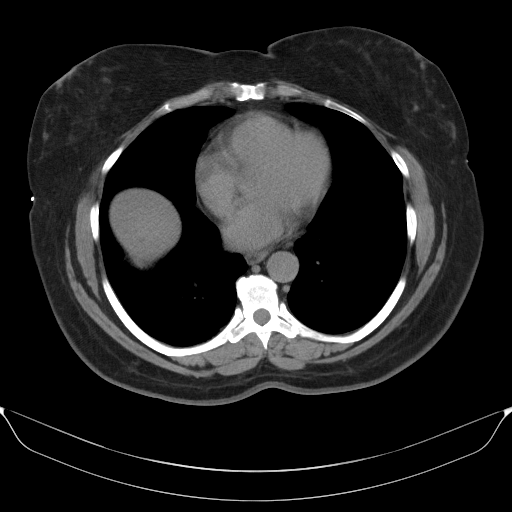
[im 91/96  lung]
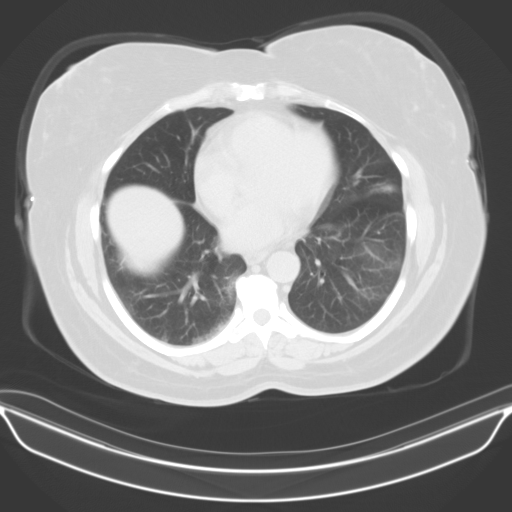

[12 of 32 positions shown; findings below may reference images not displayed]

FINDINGS: Lower chest: Mild bibasilar subsegmental atelectasis. Heart size
within normal limits.

Hepatobiliary: Mildly decreased attenuation of the hepatic
parenchyma suggesting hepatic steatosis. No focal liver lesion
identified on noncontrast imaging. Possible biliary sludge within
the gallbladder. No hyperdense gallstone. No pericholecystic
inflammatory changes by CT.

Pancreas: Unremarkable. No pancreatic ductal dilatation or
surrounding inflammatory changes.

Spleen: Normal in size without focal abnormality.

Adrenals/Urinary Tract: Adrenal glands are unremarkable. Kidneys are
normal, without renal calculi, focal lesion, or hydronephrosis.
Urinary bladder is decompressed, limiting its evaluation.

Stomach/Bowel: Small hiatal hernia. Stomach otherwise within normal
limits. No dilated loops of bowel. Postsurgical changes of small
bowel anastomosis within the right lower quadrant. Scattered
diverticulosis throughout the colon, most pronounced within the
descending colon. No focal bowel wall thickening or inflammatory
changes. A noninflamed appendix is present within the right lower
quadrant.

Vascular/Lymphatic: No significant vascular findings are evident on
noncontrast study. No enlarged abdominal or pelvic lymph nodes.

Reproductive: Appendix is surgically absent. Bilobed cystic
structure emanating from the right ovary measuring up to 6.4 cm with
simple fluid internal density. Unremarkable left adnexa.

Other: No free fluid. No abdominopelvic fluid collection. No
pneumoperitoneum. No abdominal wall hernia. Postsurgical changes to
the anterior abdominal wall.

Musculoskeletal: No acute or significant osseous findings.
IMPRESSION: 1. No acute abdominopelvic findings. Specifically, no evidence of
obstructive uropathy.
2. Bilobed 6.4 cm cystic structure emanating from the right ovary
measuring up to 6.4 cm. Follow-up by US is recommended in 3-6
months. Note: This recommendation does not apply to premenarchal
patients and to those with increased risk (genetic, family history,
elevated tumor markers or other high-risk factors) of ovarian
cancer. Reference: JACR [DATE]):[id number]
3. Colonic diverticulosis without evidence of acute diverticulitis.
4. Suspect mild hepatic steatosis. Possible biliary sludge within
the gallbladder.
5. Small hiatal hernia.

ADDENDUM:
Dictation error is present within the Findings section of the
report. Under the Reproductive subheading, the first sentence should
state "Uterus is surgically absent." Patient has history of
supracervical hysterectomy. Original report incorrectly noted that
the appendix was surgically absent in the reproductive section.
However, within the Stomach/Bowel subheading, note is made of a
noninflamed appendix located within the right lower quadrant (see
series 3, image 55).

No changes to the Impression section of the original report.

*** End of Addendum ***
FINDINGS: Lower chest: Mild bibasilar subsegmental atelectasis. Heart size
within normal limits.

Hepatobiliary: Mildly decreased attenuation of the hepatic
parenchyma suggesting hepatic steatosis. No focal liver lesion
identified on noncontrast imaging. Possible biliary sludge within
the gallbladder. No hyperdense gallstone. No pericholecystic
inflammatory changes by CT.

Pancreas: Unremarkable. No pancreatic ductal dilatation or
surrounding inflammatory changes.

Spleen: Normal in size without focal abnormality.

Adrenals/Urinary Tract: Adrenal glands are unremarkable. Kidneys are
normal, without renal calculi, focal lesion, or hydronephrosis.
Urinary bladder is decompressed, limiting its evaluation.

Stomach/Bowel: Small hiatal hernia. Stomach otherwise within normal
limits. No dilated loops of bowel. Postsurgical changes of small
bowel anastomosis within the right lower quadrant. Scattered
diverticulosis throughout the colon, most pronounced within the
descending colon. No focal bowel wall thickening or inflammatory
changes. A noninflamed appendix is present within the right lower
quadrant.

Vascular/Lymphatic: No significant vascular findings are evident on
noncontrast study. No enlarged abdominal or pelvic lymph nodes.

Reproductive: Appendix is surgically absent. Bilobed cystic
structure emanating from the right ovary measuring up to 6.4 cm with
simple fluid internal density. Unremarkable left adnexa.

Other: No free fluid. No abdominopelvic fluid collection. No
pneumoperitoneum. No abdominal wall hernia. Postsurgical changes to
the anterior abdominal wall.

Musculoskeletal: No acute or significant osseous findings.
IMPRESSION: 1. No acute abdominopelvic findings. Specifically, no evidence of
obstructive uropathy.
2. Bilobed 6.4 cm cystic structure emanating from the right ovary
measuring up to 6.4 cm. Follow-up by US is recommended in 3-6
months. Note: This recommendation does not apply to premenarchal
patients and to those with increased risk (genetic, family history,
elevated tumor markers or other high-risk factors) of ovarian
cancer. Reference: JACR [DATE]):[id number]
3. Colonic diverticulosis without evidence of acute diverticulitis.
4. Suspect mild hepatic steatosis. Possible biliary sludge within
the gallbladder.
5. Small hiatal hernia.

## 2023-03-12 ENCOUNTER — Ambulatory Visit (INDEPENDENT_AMBULATORY_CARE_PROVIDER_SITE_OTHER): Payer: BC Managed Care – PPO | Admitting: Student

## 2023-03-12 ENCOUNTER — Encounter (HOSPITAL_BASED_OUTPATIENT_CLINIC_OR_DEPARTMENT_OTHER): Payer: Self-pay | Admitting: Student

## 2023-03-12 ENCOUNTER — Ambulatory Visit (INDEPENDENT_AMBULATORY_CARE_PROVIDER_SITE_OTHER): Payer: BC Managed Care – PPO

## 2023-03-12 ENCOUNTER — Other Ambulatory Visit (HOSPITAL_BASED_OUTPATIENT_CLINIC_OR_DEPARTMENT_OTHER): Payer: Self-pay | Admitting: Student

## 2023-03-12 DIAGNOSIS — S82402D Unspecified fracture of shaft of left fibula, subsequent encounter for closed fracture with routine healing: Secondary | ICD-10-CM | POA: Diagnosis not present

## 2023-03-12 DIAGNOSIS — S82865D Nondisplaced Maisonneuve's fracture of left leg, subsequent encounter for closed fracture with routine healing: Secondary | ICD-10-CM

## 2023-03-12 DIAGNOSIS — S82864D Nondisplaced Maisonneuve's fracture of right leg, subsequent encounter for closed fracture with routine healing: Secondary | ICD-10-CM

## 2023-03-12 NOTE — Progress Notes (Addendum)
Chief Complaint: Left knee/leg and ankle pain     History of Present Illness:   03/12/23: Patient is 8 weeks status post left Maisonneuve fracture.  Today she reports that her symptoms have been steadily improving.  She is not having any pain today.  Her ankle range of motion has got much better states that her ankle has not been swelling as significantly.  She has been walking around at home without the boot on which does eventually cause some soreness and mild swelling.  Has been wearing the boot most of the time while going out of the house.  She does note some sensitivity over the front of the shin.  Has continued icing as well as taking Tylenol and alternating meloxicam and ibuprofen.   02/12/23: Patient presents today for 4 week follow up status post left maisonneuve fracture.  She does report some continued pain and swelling, particularly at the ankle.  She has been wearing the walking boot.  Pain gets worse at night and with prolonged standing which cause it to throb.  Pain an 8/10 at its worst.  She has been taking meloxicam and tylenol for pain control.  Does also note some swelling below the knee and that the knee itself feels tight.   Surgical History:   None  PMH/PSH/Family History/Social History/Meds/Allergies:    Past Medical History:  Diagnosis Date   Asthma    GERD (gastroesophageal reflux disease)    HTN (hypertension)    Hyperlipidemia    Past Surgical History:  Procedure Laterality Date   ABDOMINAL HYSTERECTOMY     partial small bowel resection  2017   complication of umbilical hernia repair   TUBAL LIGATION     UMBILICAL HERNIA REPAIR     Social History   Socioeconomic History   Marital status: Single    Spouse name: Not on file   Number of children: Not on file   Years of education: Not on file   Highest education level: Not on file  Occupational History   Occupation: customer service  Tobacco Use   Smoking status:  Never   Smokeless tobacco: Never  Vaping Use   Vaping Use: Never used  Substance and Sexual Activity   Alcohol use: Not Currently   Drug use: No   Sexual activity: Yes    Birth control/protection: Surgical    Comment: Hyst  Other Topics Concern   Not on file  Social History Narrative   Not on file   Social Determinants of Health   Financial Resource Strain: Not on file  Food Insecurity: Not on file  Transportation Needs: Not on file  Physical Activity: Not on file  Stress: Not on file  Social Connections: Not on file   Family History  Problem Relation Age of Onset   Hypertension Mother    Cancer Father    Rashes / Skin problems Daughter    Rheum arthritis Daughter    Allergies  Allergen Reactions   Crestor [Rosuvastatin] Other (See Comments)    Joint pain   Lipitor [Atorvastatin] Other (See Comments)    Muscle pain   Sulfonamide Derivatives Hives   Current Outpatient Medications  Medication Sig Dispense Refill   ezetimibe (ZETIA) 10 MG tablet Take 1 tablet (10 mg total) by mouth daily. 90 tablet 3   albuterol (VENTOLIN  HFA) 108 (90 Base) MCG/ACT inhaler Inhale 2 puffs into the lungs every 6 (six) hours as needed for wheezing or shortness of breath. 18 g 11   amLODipine (NORVASC) 10 MG tablet Take 1 tablet (10 mg total) by mouth daily. 90 tablet 1   fluticasone (FLONASE) 50 MCG/ACT nasal spray one spray by Both Nostrils route daily. 16 g 11   hydrochlorothiazide (MICROZIDE) 12.5 MG capsule TAKE 1 CAPSULE(12.5 MG) BY MOUTH DAILY 90 capsule 1   ibuprofen (ADVIL,MOTRIN) 800 MG tablet Take 1 tablet (800 mg total) by mouth daily as needed for moderate pain. 90 tablet 3   meloxicam (MOBIC) 15 MG tablet TAKE 1 TABLET BY MOUTH EVERY DAY 90 tablet 1   omeprazole (PRILOSEC) 40 MG capsule TAKE 1 CAPSULE BY MOUTH EVERY DAY 90 capsule 1   ondansetron (ZOFRAN) 4 MG tablet Take 1 tablet (4 mg total) by mouth every 8 (eight) hours as needed for nausea or vomiting. (Patient not taking:  Reported on 10/24/2022) 40 tablet 0   Plecanatide (TRULANCE) 3 MG TABS 1 tab by mouth once daily as needed 90 tablet 3   potassium chloride (KLOR-CON M) 10 MEQ tablet Take 2 tablets (20 mEq total) by mouth daily. 180 tablet 3   potassium chloride (KLOR-CON) 10 MEQ tablet Take 2 tablets (20 mEq total) by mouth daily. 180 tablet 1   Semaglutide-Weight Management (WEGOVY) 0.5 MG/0.5ML SOAJ Inject 0.5 mg into the skin once a week. 2 mL 11   zolpidem (AMBIEN) 10 MG tablet TAKE 1 TABLET(10 MG) BY MOUTH AT BEDTIME AS NEEDED FOR SLEEP 90 tablet 1   No current facility-administered medications for this visit.   No results found.  Review of Systems:   A ROS was performed including pertinent positives and negatives as documented in the HPI.  Physical Exam :   Constitutional: NAD and appears stated age Neurological: Alert and oriented Psych: Appropriate affect and cooperative There were no vitals taken for this visit.   Comprehensive Musculoskeletal Exam:    Minimal swelling of the left ankle with tenderness only over the lateral malleolus and ATFL.  Able to actively dorsiflex the ankle to 20 degrees and plantar flex to 30 degrees.  Negative anterior drawer.  DP pulse 2+.  No tenderness over proximal tib-fib.  Full active range of motion of the knee without medial or lateral joint line tenderness.  Ambulating with normal gait pattern with the use of walking boot.   Imaging:   Xray (left tibia/fibula 2 views): Nondisplaced proximal fibula fractures with good callus formation   I personally reviewed and interpreted the radiographs.   Assessment:   52 y.o. female now 8 weeks status post left Maisonneuve fracture.  Her ankle has continued to improve as range of motion is almost back to full and swelling has decreased.  She does have some residual lateral ankle weakness likely as result of immobilization.  X-rays do show strong callus formation of the proximal fibula fracture without any increased  displacement.  At this point I recommend continuing to wean out of the boot as tolerated over the next few weeks.  Will extend work accommodations for a few more weeks in order to give her time to rest and elevate the leg.  We discussed that should she have difficulty working completely out of the boot or feels significant weakness that I could refer her over to physical therapy to help with strengthening.  She will let me know how she is progressing over the next few  weeks to determine if this is necessary.  Otherwise I will plan to see her back in 6 weeks for final x-ray and reevaluation.   Plan :    - Return to clinic in 6 weeks for final reevaluation     I personally saw and evaluated the patient, and participated in the management and treatment plan.  Hazle Nordmann, PA-C Orthopedics  This document was dictated using Conservation officer, historic buildings. A reasonable attempt at proof reading has been made to minimize errors.

## 2023-04-03 ENCOUNTER — Other Ambulatory Visit: Payer: Self-pay | Admitting: Internal Medicine

## 2023-04-04 ENCOUNTER — Other Ambulatory Visit: Payer: Self-pay

## 2023-04-24 ENCOUNTER — Ambulatory Visit (HOSPITAL_BASED_OUTPATIENT_CLINIC_OR_DEPARTMENT_OTHER): Payer: BC Managed Care – PPO | Admitting: Student

## 2023-05-02 ENCOUNTER — Encounter (HOSPITAL_BASED_OUTPATIENT_CLINIC_OR_DEPARTMENT_OTHER): Payer: Self-pay | Admitting: Student

## 2023-05-02 ENCOUNTER — Ambulatory Visit (INDEPENDENT_AMBULATORY_CARE_PROVIDER_SITE_OTHER): Payer: BC Managed Care – PPO | Admitting: Student

## 2023-05-02 ENCOUNTER — Ambulatory Visit (INDEPENDENT_AMBULATORY_CARE_PROVIDER_SITE_OTHER): Payer: BC Managed Care – PPO

## 2023-05-02 DIAGNOSIS — S82435D Nondisplaced oblique fracture of shaft of left fibula, subsequent encounter for closed fracture with routine healing: Secondary | ICD-10-CM | POA: Diagnosis not present

## 2023-05-02 DIAGNOSIS — S82865D Nondisplaced Maisonneuve's fracture of left leg, subsequent encounter for closed fracture with routine healing: Secondary | ICD-10-CM

## 2023-05-02 NOTE — Progress Notes (Signed)
Chief Complaint: Left knee/leg and ankle pain     History of Present Illness:   05/02/23: Patient returns today for left leg and ankle follow-up overall doing much better.  Reports that she has been walking without the boot now for 2 weeks without difficulty.  She does report some mild pain and swelling after long days of activity and walking.  Pain at baseline however is minimal.  She has been taking meloxicam 15 mg or ibuprofen 800 mg with Tylenol.   03/12/23: Patient is 8 weeks status post left Maisonneuve fracture.  Today she reports that her symptoms have been steadily improving.  She is not having any pain today.  Her ankle range of motion has got much better states that her ankle has not been swelling as significantly.  She has been walking around at home without the boot on which does eventually cause some soreness and mild swelling.  Has been wearing the boot most of the time while going out of the house.  She does note some sensitivity over the front of the shin.  Has continued icing as well as taking Tylenol and alternating meloxicam and ibuprofen.  Surgical History:   None  PMH/PSH/Family History/Social History/Meds/Allergies:    Past Medical History:  Diagnosis Date   Asthma    GERD (gastroesophageal reflux disease)    HTN (hypertension)    Hyperlipidemia    Past Surgical History:  Procedure Laterality Date   ABDOMINAL HYSTERECTOMY     partial small bowel resection  2017   complication of umbilical hernia repair   TUBAL LIGATION     UMBILICAL HERNIA REPAIR     Social History   Socioeconomic History   Marital status: Single    Spouse name: Not on file   Number of children: Not on file   Years of education: Not on file   Highest education level: Not on file  Occupational History   Occupation: customer service  Tobacco Use   Smoking status: Never   Smokeless tobacco: Never  Vaping Use   Vaping status: Never Used  Substance and  Sexual Activity   Alcohol use: Not Currently   Drug use: No   Sexual activity: Yes    Birth control/protection: Surgical    Comment: Hyst  Other Topics Concern   Not on file  Social History Narrative   Not on file   Social Determinants of Health   Financial Resource Strain: Not on file  Food Insecurity: Not on file  Transportation Needs: Not on file  Physical Activity: Not on file  Stress: Not on file  Social Connections: Unknown (01/19/2022)   Received from [facility]   Social Network    Social Network: Not on file   Family History  Problem Relation Age of Onset   Hypertension Mother    Cancer Father    Rashes / Skin problems Daughter    Rheum arthritis Daughter    Allergies  Allergen Reactions   Crestor [Rosuvastatin] Other (See Comments)    Joint pain   Lipitor [Atorvastatin] Other (See Comments)    Muscle pain   Sulfonamide Derivatives Hives   Current Outpatient Medications  Medication Sig Dispense Refill   ezetimibe (ZETIA) 10 MG tablet Take 1 tablet (10 mg total) by mouth daily. 90 tablet 3   albuterol (  VENTOLIN HFA) 108 (90 Base) MCG/ACT inhaler Inhale 2 puffs into the lungs every 6 (six) hours as needed for wheezing or shortness of breath. 18 g 11   amLODipine (NORVASC) 10 MG tablet Take 1 tablet (10 mg total) by mouth daily. 90 tablet 1   fluticasone (FLONASE) 50 MCG/ACT nasal spray one spray by Both Nostrils route daily. 16 g 11   hydrochlorothiazide (MICROZIDE) 12.5 MG capsule TAKE 1 CAPSULE(12.5 MG) BY MOUTH DAILY 90 capsule 1   ibuprofen (ADVIL,MOTRIN) 800 MG tablet Take 1 tablet (800 mg total) by mouth daily as needed for moderate pain. 90 tablet 3   meloxicam (MOBIC) 15 MG tablet TAKE 1 TABLET BY MOUTH EVERY DAY 90 tablet 1   omeprazole (PRILOSEC) 40 MG capsule TAKE 1 CAPSULE BY MOUTH EVERY DAY 90 capsule 1   ondansetron (ZOFRAN) 4 MG tablet Take 1 tablet (4 mg total) by mouth every 8 (eight) hours as needed for nausea or vomiting.  (Patient not taking: Reported on 10/24/2022) 40 tablet 0   Plecanatide (TRULANCE) 3 MG TABS 1 tab by mouth once daily as needed 90 tablet 3   potassium chloride (KLOR-CON M) 10 MEQ tablet Take 2 tablets (20 mEq total) by mouth daily. 180 tablet 3   potassium chloride (KLOR-CON) 10 MEQ tablet Take 2 tablets (20 mEq total) by mouth daily. 180 tablet 1   Semaglutide-Weight Management (WEGOVY) 0.5 MG/0.5ML SOAJ Inject 0.5 mg into the skin once a week. 2 mL 11   zolpidem (AMBIEN) 10 MG tablet TAKE 1 TABLET(10 MG) BY MOUTH AT BEDTIME AS NEEDED FOR SLEEP 90 tablet 1   No current facility-administered medications for this visit.   No results found.  Review of Systems:   A ROS was performed including pertinent positives and negatives as documented in the HPI.  Physical Exam :   Constitutional: NAD and appears stated age Neurological: Alert and oriented Psych: Appropriate affect and cooperative There were no vitals taken for this visit.   Comprehensive Musculoskeletal Exam:    No notable tenderness throughout the left ankle.  Full active ankle ROM.  Negative anterior drawer.  DP and PT T pulses 2+.  Mild tenderness over the proximal anterior tib-fib.  No tenderness about the left knee with full active range of motion.  Neurosensory exam fully intact.  Imaging:   Xray (Left tibia/fibula 2 views): Healed fracture of proximal fibula   I personally reviewed and interpreted the radiographs.   Assessment:   52 y.o. female status post left Maisonneuve fracture sustained on 01/13/2023.  X-rays today do show that the proximal fibula fracture has healed in well.  She is continued having some mild pain and swelling after long periods of activity which is normal for this point in her recovery.  I do suspect this will continue to improve over time.  I believe she has progressed well enough at this point to have her return on an as-needed basis.  She may still be a candidate for some physical therapy for  strengthening if she feels like this is necessary.  Can continue to monitor for any residual anterior ankle issues.  Plan :    - Return to clinic as needed     I personally saw and evaluated the patient, and participated in the management and treatment plan.  Hazle Nordmann, PA-C Orthopedics  This document was dictated using Conservation officer, historic buildings. A reasonable attempt at proof reading has been made to minimize errors.

## 2023-05-28 ENCOUNTER — Other Ambulatory Visit: Payer: Self-pay | Admitting: Internal Medicine

## 2023-05-28 ENCOUNTER — Other Ambulatory Visit: Payer: Self-pay

## 2023-06-28 ENCOUNTER — Other Ambulatory Visit: Payer: Self-pay

## 2023-06-28 ENCOUNTER — Other Ambulatory Visit: Payer: Self-pay | Admitting: Internal Medicine

## 2023-07-02 ENCOUNTER — Telehealth (INDEPENDENT_AMBULATORY_CARE_PROVIDER_SITE_OTHER): Payer: BC Managed Care – PPO | Admitting: Internal Medicine

## 2023-07-02 ENCOUNTER — Other Ambulatory Visit (INDEPENDENT_AMBULATORY_CARE_PROVIDER_SITE_OTHER): Payer: BC Managed Care – PPO

## 2023-07-02 DIAGNOSIS — K529 Noninfective gastroenteritis and colitis, unspecified: Secondary | ICD-10-CM

## 2023-07-02 DIAGNOSIS — R197 Diarrhea, unspecified: Secondary | ICD-10-CM

## 2023-07-02 DIAGNOSIS — E559 Vitamin D deficiency, unspecified: Secondary | ICD-10-CM

## 2023-07-02 DIAGNOSIS — K219 Gastro-esophageal reflux disease without esophagitis: Secondary | ICD-10-CM | POA: Diagnosis not present

## 2023-07-02 DIAGNOSIS — R112 Nausea with vomiting, unspecified: Secondary | ICD-10-CM

## 2023-07-02 LAB — URINALYSIS, ROUTINE W REFLEX MICROSCOPIC
Bilirubin Urine: NEGATIVE
Hgb urine dipstick: NEGATIVE
Ketones, ur: NEGATIVE
Nitrite: NEGATIVE
RBC / HPF: NONE SEEN (ref 0–?)
Specific Gravity, Urine: 1.015 (ref 1.000–1.030)
Total Protein, Urine: NEGATIVE
Urine Glucose: NEGATIVE
Urobilinogen, UA: 0.2 (ref 0.0–1.0)
pH: 7 (ref 5.0–8.0)

## 2023-07-02 LAB — BASIC METABOLIC PANEL
BUN: 16 mg/dL (ref 6–23)
CO2: 28 meq/L (ref 19–32)
Calcium: 10.2 mg/dL (ref 8.4–10.5)
Chloride: 99 meq/L (ref 96–112)
Creatinine, Ser: 0.85 mg/dL (ref 0.40–1.20)
GFR: 78.36 mL/min (ref >60.00)
Glucose, Bld: 87 mg/dL (ref 70–99)
Potassium: 3.5 meq/L (ref 3.5–5.1)
Sodium: 139 meq/L (ref 135–145)

## 2023-07-02 LAB — CBC WITH DIFFERENTIAL/PLATELET
Basophils Absolute: 0.1 10*3/uL (ref 0.0–0.1)
Basophils Relative: 1.1 % (ref 0.0–3.0)
Eosinophils Absolute: 0.4 10*3/uL (ref 0.0–0.7)
Eosinophils Relative: 3.3 % (ref 0.0–5.0)
HCT: 44.1 % (ref 36.0–46.0)
Hemoglobin: 14.4 g/dL (ref 12.0–15.0)
Lymphocytes Relative: 27.3 % (ref 12.0–46.0)
Lymphs Abs: 3.2 10*3/uL (ref 0.7–4.0)
MCHC: 32.8 g/dL (ref 30.0–36.0)
MCV: 89.6 fL (ref 78.0–100.0)
Monocytes Absolute: 0.9 10*3/uL (ref 0.1–1.0)
Monocytes Relative: 7.2 % (ref 3.0–12.0)
Neutro Abs: 7.2 10*3/uL (ref 1.4–7.7)
Neutrophils Relative %: 61.1 % (ref 43.0–77.0)
Platelets: 325 10*3/uL (ref 150.0–400.0)
RBC: 4.93 Mil/uL (ref 3.87–5.11)
RDW: 14.9 % (ref 11.5–15.5)
WBC: 11.8 10*3/uL — ABNORMAL HIGH (ref 4.0–10.5)

## 2023-07-02 LAB — HEPATIC FUNCTION PANEL
ALT: 17 U/L (ref 0–35)
AST: 17 U/L (ref 0–37)
Albumin: 4.7 g/dL (ref 3.5–5.2)
Alkaline Phosphatase: 115 U/L (ref 39–117)
Bilirubin, Direct: 0 mg/dL (ref 0.0–0.3)
Total Bilirubin: 0.4 mg/dL (ref 0.2–1.2)
Total Protein: 8.8 g/dL — ABNORMAL HIGH (ref 6.0–8.3)

## 2023-07-02 LAB — LIPASE: Lipase: 25 U/L (ref 11.0–59.0)

## 2023-07-02 MED ORDER — ONDANSETRON 4 MG PO TBDP: 4.0000 mg | ORAL_TABLET | Freq: Three times a day (TID) | ORAL | 1 refills | Status: AC | PRN

## 2023-07-02 MED ORDER — DIPHENOXYLATE-ATROPINE 2.5-0.025 MG PO TABS: 1.0000 | ORAL_TABLET | Freq: Four times a day (QID) | ORAL | 0 refills | Status: AC | PRN

## 2023-07-02 NOTE — Patient Instructions (Signed)
Please take all new medication as prescribed- the nausea and diarrhea medications  Please continue all other medications as before, and refills have been done if requested.  Please have the pharmacy call with any other refills you may need.  Please keep your appointments with your specialists as you may have planned  You are given the work note  Please go to the LAB at the blood drawing area for the tests to be done - at the [facility] lab  You will be contacted by phone if any changes need to be made immediately.  Otherwise, you will receive a letter about your results with an explanation, but please check with MyChart first.

## 2023-07-03 ENCOUNTER — Other Ambulatory Visit: Payer: Self-pay

## 2023-07-03 ENCOUNTER — Telehealth: Payer: Self-pay | Admitting: Internal Medicine

## 2023-07-03 MED ORDER — ALBUTEROL SULFATE HFA 108 (90 BASE) MCG/ACT IN AERS: 2.0000 | INHALATION_SPRAY | Freq: Four times a day (QID) | RESPIRATORY_TRACT | 11 refills | Status: AC | PRN

## 2023-07-03 NOTE — Telephone Encounter (Signed)
Prescription Request  07/03/2023  LOV: 10/11/2022  What is the name of the medication or equipment? albuterol (VENTOLIN HFA) 108 (90 Base) MCG/ACT inhaler   Have you contacted your pharmacy to request a refill? No   Which pharmacy would you like this sent to?  [id number] [place], [place] - [address] [place] [address] Phone: [phone] Fax: [phone]    Patient notified that their request is being sent to the clinical staff for review and that they should receive a response within 2 business days.   Please advise at Mobile [phone] (mobile)

## 2023-07-05 ENCOUNTER — Encounter: Payer: Self-pay | Admitting: Internal Medicine

## 2023-07-05 DIAGNOSIS — K529 Noninfective gastroenteritis and colitis, unspecified: Secondary | ICD-10-CM | POA: Insufficient documentation

## 2023-07-05 NOTE — Assessment & Plan Note (Signed)
Stable overall, cont prilosec 40 qd

## 2023-07-05 NOTE — Progress Notes (Signed)
Patient ID: Ellen Fernandez, female   DOB: 1971/01/06, 52 y.o.   MRN: [id number]  Virtual Visit via Video Note  I connected with Ellen Fernandez on Jul 01 2024  at  2:00 PM EDT by a video enabled telemedicine application and verified that I am speaking with the correct person using two identifiers.  Location of all participants today Patient: at home Provider: at office   I discussed the limitations of evaluation and management by telemedicine and the availability of in person appointments. The patient expressed understanding and agreed to proceed.  History of Present Illness: Here with acute onset n/v/d x 3 days  denies fever, chills, blood or worsening abd pains, no sick contacts or rash  Denies worsening reflux, dysphagia,.  Pt denies chest pain, increased sob or doe, wheezing, orthopnea, PND, increased LE swelling, palpitations, dizziness or syncope.   Pt denies polydipsia, polyuria, or new focal neuro s/s.    Pt denies fever, wt loss, night sweats, loss of appetite, or other constitutional symptoms  Pt denies chest pain, increased sob or doe, wheezing, orthopnea, PND, increased LE swelling, palpitations, dizziness or syncope except for incidental onset mild wheezing sob since yesterda   Past Medical History:  Diagnosis Date   Asthma    GERD (gastroesophageal reflux disease)    HTN (hypertension)    Hyperlipidemia    Past Surgical History:  Procedure Laterality Date   ABDOMINAL HYSTERECTOMY     partial small bowel resection  2017   complication of umbilical hernia repair   TUBAL LIGATION     UMBILICAL HERNIA REPAIR      reports that she has never smoked. She has never used smokeless tobacco. She reports that she does not currently use alcohol. She reports that she does not use drugs. family history includes Cancer in her father; Hypertension in her mother; Rashes / Skin problems in her daughter; Rheum arthritis in her daughter. Allergies  Allergen Reactions   Crestor [Rosuvastatin] Other  (See Comments)    Joint pain   Lipitor [Atorvastatin] Other (See Comments)    Muscle pain   Sulfonamide Derivatives Hives   Current Outpatient Medications on File Prior to Visit  Medication Sig Dispense Refill   amLODipine (NORVASC) 10 MG tablet Take 1 tablet (10 mg total) by mouth daily. 90 tablet 1   ezetimibe (ZETIA) 10 MG tablet Take 1 tablet (10 mg total) by mouth daily. 90 tablet 3   fluticasone (FLONASE) 50 MCG/ACT nasal spray one spray by Both Nostrils route daily. 16 g 11   hydrochlorothiazide (MICROZIDE) 12.5 MG capsule TAKE 1 CAPSULE(12.5 MG) BY MOUTH DAILY 90 capsule 3   ibuprofen (ADVIL,MOTRIN) 800 MG tablet Take 1 tablet (800 mg total) by mouth daily as needed for moderate pain. 90 tablet 3   meloxicam (MOBIC) 15 MG tablet TAKE 1 TABLET BY MOUTH EVERY DAY 90 tablet 1   omeprazole (PRILOSEC) 40 MG capsule TAKE 1 CAPSULE BY MOUTH EVERY DAY 90 capsule 3   ondansetron (ZOFRAN) 4 MG tablet Take 1 tablet (4 mg total) by mouth every 8 (eight) hours as needed for nausea or vomiting. (Patient not taking: Reported on 10/24/2022) 40 tablet 0   Plecanatide (TRULANCE) 3 MG TABS 1 tab by mouth once daily as needed 90 tablet 3   potassium chloride (KLOR-CON M) 10 MEQ tablet Take 2 tablets (20 mEq total) by mouth daily. 180 tablet 3   potassium chloride (KLOR-CON) 10 MEQ tablet Take 2 tablets (20 mEq total) by mouth daily. 180 tablet  1   Semaglutide-Weight Management (WEGOVY) 0.5 MG/0.5ML SOAJ Inject 0.5 mg into the skin once a week. 2 mL 11   zolpidem (AMBIEN) 10 MG tablet TAKE 1 TABLET(10 MG) BY MOUTH AT BEDTIME AS NEEDED FOR SLEEP 90 tablet 1   No current facility-administered medications on file prior to visit.   Observations/Objective: Alert, NAD, appropriate mood and affect, resps normal, cn 2-12 intact, moves all 4s, no visible rash or swelling Lab Results  Component Value Date   WBC 11.8 (H) 07/02/2023   HGB 14.4 07/02/2023   HCT 44.1 07/02/2023   PLT 325.0 07/02/2023   GLUCOSE  87 07/02/2023   CHOL 298 (H) 10/11/2022   TRIG 99.0 10/11/2022   HDL 74.20 10/11/2022   LDLCALC 204 (H) 10/11/2022   ALT 17 07/02/2023   AST 17 07/02/2023   NA 139 07/02/2023   K 3.5 07/02/2023   CL 99 07/02/2023   CREATININE 0.85 07/02/2023   BUN 16 07/02/2023   CO2 28 07/02/2023   TSH 2.17 10/11/2022   HGBA1C 5.8 10/11/2022   Assessment and Plan: See notes  Follow Up Instructions: See notes   I discussed the assessment and treatment plan with the patient. The patient was provided an opportunity to ask questions and all were answered. The patient agreed with the plan and demonstrated an understanding of the instructions.   The patient was advised to call back or seek an in-person evaluation if the symptoms worsen or if the condition fails to improve as anticipated.   Oliver Barre, MD

## 2023-07-05 NOTE — Assessment & Plan Note (Signed)
Last vitamin D Lab Results  Component Value Date   VD25OH 26.03 (L) 10/11/2022   Low, to start oral replacement

## 2023-07-05 NOTE — Assessment & Plan Note (Addendum)
Likely viral it seems, for rest, fluids, tylenol, zofran prn, lomotil prn, work note, and labs including cbc, bmp

## 2023-07-10 ENCOUNTER — Telehealth: Payer: Self-pay | Admitting: Internal Medicine

## 2023-07-10 ENCOUNTER — Other Ambulatory Visit: Payer: Self-pay

## 2023-07-10 MED ORDER — AMLODIPINE BESYLATE 10 MG PO TABS: 10.0000 mg | ORAL_TABLET | Freq: Every day | ORAL | 3 refills | Status: DC

## 2023-07-10 NOTE — Telephone Encounter (Signed)
Prescription Request  07/10/2023  LOV: 10/11/2022  What is the name of the medication or equipment? amlodopine  Have you contacted your pharmacy to request a refill? Yes   Which pharmacy would you like this sent to?  [id number] [place], [place] - [address] [place] [address] Phone: [phone] Fax: [phone]    Patient notified that their request is being sent to the clinical staff for review and that they should receive a response within 2 business days.   Please advise at Mobile [phone] (mobile)

## 2023-07-10 NOTE — Telephone Encounter (Signed)
Refill sent.

## 2023-08-04 ENCOUNTER — Other Ambulatory Visit: Payer: Self-pay | Admitting: Internal Medicine

## 2023-08-20 ENCOUNTER — Encounter: Payer: Self-pay | Admitting: Internal Medicine

## 2023-08-20 ENCOUNTER — Ambulatory Visit (INDEPENDENT_AMBULATORY_CARE_PROVIDER_SITE_OTHER): Payer: BC Managed Care – PPO

## 2023-08-20 ENCOUNTER — Ambulatory Visit (INDEPENDENT_AMBULATORY_CARE_PROVIDER_SITE_OTHER): Payer: BC Managed Care – PPO | Admitting: Internal Medicine

## 2023-08-20 VITALS — BP 126/78 | HR 95 | Temp 98.4°F | Ht 62.0 in | Wt 224.0 lb

## 2023-08-20 DIAGNOSIS — I10 Essential (primary) hypertension: Secondary | ICD-10-CM

## 2023-08-20 DIAGNOSIS — R0989 Other specified symptoms and signs involving the circulatory and respiratory systems: Secondary | ICD-10-CM | POA: Diagnosis not present

## 2023-08-20 DIAGNOSIS — R079 Chest pain, unspecified: Secondary | ICD-10-CM

## 2023-08-20 DIAGNOSIS — E559 Vitamin D deficiency, unspecified: Secondary | ICD-10-CM

## 2023-08-20 DIAGNOSIS — J45909 Unspecified asthma, uncomplicated: Secondary | ICD-10-CM

## 2023-08-20 NOTE — Patient Instructions (Addendum)
Your EKG was [facility] today  Please continue all other medications as before, and refills have been done if requested.  Please have the pharmacy call with any other refills you may need.  Please continue your efforts at being more active, low cholesterol diet, and weight control.  You are otherwise up to date with prevention measures today.  Please keep your appointments with your specialists as you may have planned  Please go to the XRAY Department in the first floor for the x-ray testing  .You will be contacted regarding the referral for: Stress test  OK to drop off the FMLA forms when you get the chance  Please make an Appointment to return in Feb 3, or sooner if needed

## 2023-08-20 NOTE — Assessment & Plan Note (Signed)
BP Readings from Last 3 Encounters:  08/20/23 126/78  10/24/22 (!) 146/92  10/11/22 126/74   Stable, pt to continue medical treatment norvasc 10 every day, hct 12.5 qd

## 2023-08-20 NOTE — Assessment & Plan Note (Signed)
Atypical, etiology unclear, ? Msk vs cardiac, ECG reviewed, ok for cxr. Stress test,  to f/u any worsening symptoms or concerns
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
## 2023-08-20 NOTE — Assessment & Plan Note (Signed)
Stable overall, continue inhaler prn 

## 2023-08-20 NOTE — Assessment & Plan Note (Signed)
Last vitamin D Lab Results  Component Value Date   VD25OH 26.03 (L) 10/11/2022   Low, to strart oral replacement

## 2023-08-23 ENCOUNTER — Telehealth: Payer: Self-pay | Admitting: Internal Medicine

## 2023-08-23 NOTE — Telephone Encounter (Signed)
Patient dropped off document FMLA, to be filled out by provider. Patient requested to send it back via Call Patient to pick up within 7-days. Document is located in providers tray at front office.Please advise at Mobile [phone] (mobile)

## 2023-08-23 NOTE — Telephone Encounter (Signed)
Placed on provider desk

## 2023-08-28 NOTE — Telephone Encounter (Signed)
Called and let Pt know paperwork is ready for pickup and placed up front.

## 2023-08-29 NOTE — Telephone Encounter (Signed)
Patient picked up forms.

## 2023-09-10 ENCOUNTER — Other Ambulatory Visit: Payer: BC Managed Care – PPO

## 2023-09-10 ENCOUNTER — Ambulatory Visit (INDEPENDENT_AMBULATORY_CARE_PROVIDER_SITE_OTHER): Payer: BC Managed Care – PPO | Admitting: Gastroenterology

## 2023-09-10 ENCOUNTER — Encounter: Payer: Self-pay | Admitting: Gastroenterology

## 2023-09-10 VITALS — BP 130/80 | HR 119 | Ht 62.0 in | Wt 217.0 lb

## 2023-09-10 DIAGNOSIS — K581 Irritable bowel syndrome with constipation: Secondary | ICD-10-CM

## 2023-09-10 DIAGNOSIS — R1033 Periumbilical pain: Secondary | ICD-10-CM | POA: Diagnosis not present

## 2023-09-10 DIAGNOSIS — K760 Fatty (change of) liver, not elsewhere classified: Secondary | ICD-10-CM

## 2023-09-10 DIAGNOSIS — K449 Diaphragmatic hernia without obstruction or gangrene: Secondary | ICD-10-CM

## 2023-09-10 DIAGNOSIS — K219 Gastro-esophageal reflux disease without esophagitis: Secondary | ICD-10-CM

## 2023-09-10 NOTE — Progress Notes (Signed)
 Chief Complaint:   Referring Provider:  Norleen Lynwood ORN, MD      ASSESSMENT AND PLAN;   #1. GERD with small HH  #2.  Periumbilical pain (H/O PSBO after umbilical hernia repair, requiring lap with partial small bowel resection)  #3. IBS-C  #4. Fatty liver with Nl LFTs.  Plan: -CT AP with PO/IV contrast -Continue omeprazole  20mg  po every day -Miralax 17g po every day -Encouraged her to reduce weight.  Start exercising like walking -EGD/colon -Minimize nonsteroidals.   I discussed EGD/Colonoscopy- the indications, risks, alternatives and potential complications including, but not limited to bleeding, infection, reaction to meds, damage to internal organs, cardiac and/or pulmonary problems, and perforation requiring surgery. The possibility that significant findings could be missed was explained. All ? were answered. Pt consents to proceed. HPI:    Ellen Fernandez is a 52 y.o. female   Discussed the use of AI scribe software for clinical note transcription with the patient, who gave verbal consent to proceed.  History of Present Illness   The patient, with a history of umbilical hernia, then SBO req SB resection, presents with ongoing digestive issues. She reports a sensation of bloating and constipation, describing the discomfort as if 'someone's twisting my intestines.' The patient relies on herbal stimulants to facilitate bowel movements.  Recently, the patient experienced an acute episode of vomiting and diarrhea, a significant change from her usual constipation. This episode was accompanied by abdominal pain, which has persisted, albeit at a lower intensity, following the resolution of the vomiting and diarrhea. The patient reports that the pain is localized around the belly button area.  The patient has a history of kidney stones and a cyst on her ovary, discovered during a CT scan two years ago. She also has a history of fatty liver, diverticulosis, and a small hiatal hernia.  The patient is currently on omeprazole  for acid reflux, which she reports as effective.  The patient's bowel movements typically occur every two to three days and are described as small, hard pellets. She has tried over-the-counter Miralax in the past for constipation but did not find it particularly effective. The patient also reports occasional bloating and gas.  Was given prescription of Trulance -unfortunately insurance did not approve.  The patient has had two surgeries related to her umbilical hernia, the second of which involved a bowel resection due to an obstruction. She reports sensitivity in the area of the surgeries and believes she can feel the hernia. The patient also mentions a loss of sensitivity in the lower abdomen, likely due to nerve damage from the surgeries.       Wt Readings from Last 3 Encounters:  09/10/23 217 lb (98.4 kg)  08/20/23 224 lb (101.6 kg)  10/24/22 214 lb (97.1 kg)    Past GI workup: Colon at [facility]-over 15 years ago-I could not find any report in Care Everywhere Never had EGD Noncontrast CT Abdo/pelvis 02/2021 1. No acute abdominopelvic findings. Specifically, no evidence of obstructive uropathy. 2. Bilobed 6.4 cm cystic structure emanating from the right ovary measuring up to 6.4 cm. Follow-up by US  is recommended in 3-6 months. Note: This recommendation does not apply to premenarchal patients and to those with increased risk (genetic, family history, elevated tumor markers or other high-risk factors) of ovarian cancer. Reference: JACR 2020 Feb; 17(2):248-254 3. Colonic diverticulosis without evidence of acute diverticulitis. 4. Suspect mild hepatic steatosis. Possible biliary sludge within the gallbladder. 5. Small hiatal hernia.  Past Medical History:  Diagnosis Date  Asthma    GERD (gastroesophageal reflux disease)    HTN (hypertension)    Hyperlipidemia     Past Surgical History:  Procedure Laterality Date   ABDOMINAL HYSTERECTOMY      partial small bowel resection  2017   complication of umbilical hernia repair   TUBAL LIGATION     UMBILICAL HERNIA REPAIR      Family History  Problem Relation Age of Onset   Hypertension Mother    Cancer Father    Rashes / Skin problems Daughter    Rheum arthritis Daughter     Social History   Tobacco Use   Smoking status: Never   Smokeless tobacco: Never  Vaping Use   Vaping status: Never Used  Substance Use Topics   Alcohol use: Not Currently   Drug use: No    Current Outpatient Medications  Medication Sig Dispense Refill   albuterol  (VENTOLIN  HFA) 108 (90 Base) MCG/ACT inhaler Inhale 2 puffs into the lungs every 6 (six) hours as needed for wheezing or shortness of breath. 18 g 11   amLODipine  (NORVASC ) 10 MG tablet Take 1 tablet (10 mg total) by mouth daily. 90 tablet 3   diphenoxylate -atropine  (LOMOTIL ) 2.5-0.025 MG tablet Take 1 tablet by mouth 4 (four) times daily as needed for diarrhea or loose stools. 30 tablet 0   ezetimibe  (ZETIA ) 10 MG tablet Take 1 tablet (10 mg total) by mouth daily. 90 tablet 3   hydrochlorothiazide  (MICROZIDE ) 12.5 MG capsule TAKE 1 CAPSULE(12.5 MG) BY MOUTH DAILY 90 capsule 3   ibuprofen  (ADVIL ,MOTRIN ) 800 MG tablet Take 1 tablet (800 mg total) by mouth daily as needed for moderate pain. 90 tablet 3   meloxicam  (MOBIC ) 15 MG tablet TAKE 1 TABLET BY MOUTH EVERY DAY 90 tablet 1   omeprazole  (PRILOSEC) 40 MG capsule TAKE 1 CAPSULE BY MOUTH EVERY DAY 90 capsule 3   ondansetron  (ZOFRAN ) 4 MG tablet Take 1 tablet (4 mg total) by mouth every 8 (eight) hours as needed for nausea or vomiting. 40 tablet 0   ondansetron  (ZOFRAN -ODT) 4 MG disintegrating tablet Take 1 tablet (4 mg total) by mouth every 8 (eight) hours as needed. 30 tablet 1   potassium chloride  (KLOR-CON  M) 10 MEQ tablet Take 2 tablets (20 mEq total) by mouth daily. 180 tablet 3   potassium chloride  (KLOR-CON ) 10 MEQ tablet Take 2 tablets (20 mEq total) by mouth daily. 180 tablet 1    zolpidem  (AMBIEN ) 10 MG tablet TAKE 1 TABLET(10 MG) BY MOUTH AT BEDTIME AS NEEDED FOR SLEEP 90 tablet 1   fluticasone  (FLONASE ) 50 MCG/ACT nasal spray one spray by Both Nostrils route daily. (Patient not taking: Reported on 09/10/2023) 16 g 11   Plecanatide  (TRULANCE ) 3 MG TABS 1 tab by mouth once daily as needed (Patient not taking: Reported on 09/10/2023) 90 tablet 3   Semaglutide -Weight Management (WEGOVY ) 0.5 MG/0.5ML SOAJ Inject 0.5 mg into the skin once a week. (Patient not taking: Reported on 09/10/2023) 2 mL 11   No current facility-administered medications for this visit.    Allergies  Allergen Reactions   Crestor  [Rosuvastatin ] Other (See Comments)    Joint pain   Lipitor [Atorvastatin ] Other (See Comments)    Muscle pain   Sulfonamide Derivatives Hives    Review of Systems:  neg     Physical Exam:    BP 130/80   Pulse (!) 119   Ht 5' 2 (1.575 m)   Wt 217 lb (98.4 kg)   BMI 39.69  kg/m  Wt Readings from Last 3 Encounters:  09/10/23 217 lb (98.4 kg)  08/20/23 224 lb (101.6 kg)  10/24/22 214 lb (97.1 kg)   Constitutional:  Well-developed, in no acute distress. Psychiatric: Normal mood and affect. Behavior is normal. HEENT: Pupils normal.  Conjunctivae are normal. No scleral icterus. Cardiovascular: Normal rate, regular rhythm. No edema Pulmonary/chest: Effort normal and breath sounds normal. No wheezing, rales or rhonchi. Abdominal: Soft, nondistended.  Mild tenderness around umbilicus.  No definite hernia.  Well-healed surgical scar. Bowel sounds active throughout. There are no masses palpable. No hepatomegaly. Rectal: Deferred Neurological: Alert and oriented to person place and time. Skin: Skin is warm and dry. No rashes noted.  Data Reviewed: I have personally reviewed following labs and imaging studies  CBC:    Latest Ref Rng & Units 07/02/2023    4:37 PM 10/11/2022    9:15 AM 01/30/2022    4:35 PM  CBC  WBC 4.0 - 10.5 K/uL 11.8  7.7  10.0   Hemoglobin  12.0 - 15.0 g/dL 85.5  85.5  85.5   Hematocrit 36.0 - 46.0 % 44.1  43.1  43.0   Platelets 150.0 - 400.0 K/uL 325.0  297.0  323.0     CMP:    Latest Ref Rng & Units 07/02/2023    4:37 PM 10/11/2022    9:15 AM 01/30/2022    4:35 PM  CMP  Glucose 70 - 99 mg/dL 87  98  81   BUN 6 - 23 mg/dL 16  13  20    Creatinine 0.40 - 1.20 mg/dL 9.14  9.18  9.14   Sodium 135 - 145 mEq/L 139  139  136   Potassium 3.5 - 5.1 mEq/L 3.5  4.2  3.4   Chloride 96 - 112 mEq/L 99  101  96   CO2 19 - 32 mEq/L 28  32  29   Calcium  8.4 - 10.5 mg/dL 89.7  9.9  9.8   Total Protein 6.0 - 8.3 g/dL 8.8  8.1  8.0   Total Bilirubin 0.2 - 1.2 mg/dL 0.4  0.4  0.4   Alkaline Phos 39 - 117 U/L 115  100  98   AST 0 - 37 U/L 17  22  20    ALT 0 - 35 U/L 17  20  19        Radiology Studies: DG Chest 2 View Result Date: 08/26/2023 CLINICAL DATA:  52 year old female with chest pain and shortness of breath EXAM: CHEST - 2 VIEW COMPARISON:  03/06/2018 FINDINGS: Cardiomediastinal silhouette unchanged in size and contour. No evidence of central vascular congestion. No interlobular septal thickening. Low lung volumes. No pneumothorax or pleural effusion. Coarsened interstitial markings, with no confluent airspace disease. No acute displaced fracture. Degenerative changes of the spine. IMPRESSION: Negative for acute cardiopulmonary disease Electronically Signed   By: Ami Bellman D.O.   On: 08/26/2023 11:38      Anselm Bring, MD 09/10/2023, 10:55 AM  Cc: Norleen Lynwood ORN, MD

## 2023-09-10 NOTE — Patient Instructions (Addendum)
 _______________________________________________________  If your blood pressure at your visit was 140/90 or greater, please contact your primary care physician to follow up on this.  _______________________________________________________  If you are age 53 or older, your body mass index should be between 23-30. Your Body mass index is 39.69 kg/m. If this is out of the aforementioned range listed, please consider follow up with your Primary Care Provider.  If you are age 68 or younger, your body mass index should be between 19-25. Your Body mass index is 39.69 kg/m. If this is out of the aformentioned range listed, please consider follow up with your Primary Care Provider.   ________________________________________________________  The [facility] providers would like to encourage you to use MYCHART to communicate with providers for non-urgent requests or questions.  Due to long hold times on the telephone, sending your provider a message by [facility] may be a faster and more efficient way to get a response.  Please allow 48 business hours for a response.  Please remember that this is for non-urgent requests.  _______________________________________________________'  Your provider has requested that you go to the basement level for lab work before leaving today. Press B on the elevator. The lab is located at the first door on the left as you exit the elevator.  Continue omeprazole  daily. Start Miralax 17g daily in [place] of water. Please start walking at least 3 times a week  You have been scheduled for a CT scan of the abdomen and pelvis at [facility] [place], [place], [place] [address]).   You are scheduled on 09-18-2023 at 4pm. You should arrive by 145pm your appointment time for registration. Please follow the written instructions below on the day of your exam:  WARNING: IF YOU ARE ALLERGIC TO IODINE/X-RAY DYE, PLEASE NOTIFY RADIOLOGY IMMEDIATELY AT [phone]! YOU WILL BE  GIVEN A 13 HOUR PREMEDICATION PREP.  1) Do not eat or drink anything after 12pm (4 hours prior to your test) 2) You will be given 2 bottles of oral contrast to drink on site.    Drink 1 bottle of contrast @ 2pm (2 hours prior to your exam)  Drink 1 bottle of contrast @  3pm (1 hour prior to your exam)  You may take any medications as prescribed with a small amount of water, if necessary. If you take any of the following medications: METFORMIN, GLUCOPHAGE, GLUCOVANCE, AVANDAMET, RIOMET, FORTAMET, ACTOPLUS MET, JANUMET, GLUMETZA or METAGLIP, you MAY be asked to HOLD this medication 48 hours AFTER the exam.  The purpose of you drinking the oral contrast is to aid in the visualization of your intestinal tract. The contrast solution may cause some diarrhea. Depending on your individual set of symptoms, you may also receive an intravenous injection of x-ray contrast/dye. Plan on being at [facility] for 30 minutes or longer, depending on the type of exam you are having performed.  This test typically takes 30-45 minutes to complete.  If you have any questions regarding your exam or if you need to reschedule, you may call the CT department at [phone] between the hours of 8:00 am and 5:00 pm, Monday-Friday.  It has been recommended to you by your physician that you have a(n) EGD/Colon completed. Please contact our office at [phone] in January. You will be scheduled for a pre-visit and procedure at that time.  _  _   ORAL DIABETIC MEDICATION INSTRUCTIONS  The day before your procedure:  Take your diabetic pill as you do normally  The day of your procedure:  Do not take your diabetic pill   We will check your blood sugar levels during the admission process and again in Recovery before discharging you home  ______________________________________________________________________  _  _   INSULIN (LONG ACTING) MEDICATION INSTRUCTIONS (Lantus, NPH, 70/30, Humulin, Novolin-N,  Levemir, Toujeo, Tresiba, Semglee )   The day before your procedure:  Take  your regular evening dose    The day of your procedure:  Do not take your morning dose   _  _   INSULIN (SHORT ACTING) MEDICATION INSTRUCTIONS (Regular, Humulog, Novolog, Apidra, Novolin, Humulin)   The day before your procedure:  Do not take your evening dose   The day of your procedure:  Do not take your morning dose  ______________________________________________________________________                _ _ OTHER NON-INSULIN GLPI AGONIST INJECTABLE OR ORAL DAILY MEDICATIONS         (Tanzeum, Saxenda Byetta, Victoza, SymlinPen, Rebelsus)  Hold the day of the procedure ______________________________________________________________________________________________________________________________________________________     __  __ GLP 1 AGONIST Weekly injectables                   (Bydureon Bcise, Ozempic, Wegovy , Trulicity, Zepbound , Mounjaro)    Hold for 7 days prior to the procedure    _  _   INSULIN PUMP MEDICATION INSTRUCTIONS We will contact the physician managing your diabetic care for written dosage instructions for the day before your procedure and the day of your procedure.  Once we have received the instructions, we will contact you.   Thank you,  Dr. Lynnie Bring

## 2023-09-11 LAB — BUN/CREATININE RATIO
BUN: 15 mg/dL (ref 7–25)
Creat: 0.95 mg/dL (ref 0.50–1.03)
eGFR: 72 mL/min/{1.73_m2} (ref >=60)

## 2023-09-16 ENCOUNTER — Encounter: Payer: Self-pay | Admitting: Gastroenterology

## 2023-09-16 ENCOUNTER — Telehealth: Payer: Self-pay

## 2023-09-16 NOTE — Telephone Encounter (Signed)
 LVM for patient to schedule EGD/Colon

## 2023-09-16 NOTE — Telephone Encounter (Signed)
 Someone scheduled her and with previsit

## 2023-09-18 ENCOUNTER — Ambulatory Visit (HOSPITAL_COMMUNITY)
Admission: RE | Admit: 2023-09-18 | Discharge: 2023-09-18 | Disposition: A | Discharge status: Home / Self Care | Payer: BC Managed Care – PPO | Source: Ambulatory Visit | Attending: Gastroenterology | Admitting: Gastroenterology

## 2023-09-18 ENCOUNTER — Other Ambulatory Visit (HOSPITAL_COMMUNITY): Payer: Self-pay

## 2023-09-18 DIAGNOSIS — K5730 Diverticulosis of large intestine without perforation or abscess without bleeding: Secondary | ICD-10-CM | POA: Diagnosis not present

## 2023-09-18 DIAGNOSIS — K449 Diaphragmatic hernia without obstruction or gangrene: Secondary | ICD-10-CM | POA: Insufficient documentation

## 2023-09-18 DIAGNOSIS — K760 Fatty (change of) liver, not elsewhere classified: Secondary | ICD-10-CM | POA: Diagnosis not present

## 2023-09-18 DIAGNOSIS — K581 Irritable bowel syndrome with constipation: Secondary | ICD-10-CM | POA: Diagnosis not present

## 2023-09-18 DIAGNOSIS — K5900 Constipation, unspecified: Secondary | ICD-10-CM | POA: Diagnosis not present

## 2023-09-18 DIAGNOSIS — R1033 Periumbilical pain: Secondary | ICD-10-CM | POA: Insufficient documentation

## 2023-09-18 DIAGNOSIS — K219 Gastro-esophageal reflux disease without esophagitis: Secondary | ICD-10-CM | POA: Diagnosis not present

## 2023-09-18 MED ORDER — IOHEXOL 300 MG/ML  SOLN
30.0000 mL | Freq: Once | INTRAMUSCULAR | Status: AC | PRN
  Administered 2023-09-18: 30 mL via ORAL

## 2023-09-18 MED ORDER — IOHEXOL 300 MG/ML  SOLN
100.0000 mL | Freq: Once | INTRAMUSCULAR | Status: AC | PRN
  Administered 2023-09-18: 100 mL via INTRAVENOUS

## 2023-10-14 ENCOUNTER — Encounter: Payer: BC Managed Care – PPO | Admitting: Internal Medicine

## 2023-10-24 ENCOUNTER — Encounter: Payer: Self-pay | Admitting: Internal Medicine

## 2023-10-24 ENCOUNTER — Ambulatory Visit (INDEPENDENT_AMBULATORY_CARE_PROVIDER_SITE_OTHER): Payer: BC Managed Care – PPO | Admitting: Internal Medicine

## 2023-10-24 VITALS — BP 118/70 | HR 95 | Temp 98.1°F | Ht 62.0 in | Wt 220.0 lb

## 2023-10-24 DIAGNOSIS — J309 Allergic rhinitis, unspecified: Secondary | ICD-10-CM | POA: Diagnosis not present

## 2023-10-24 DIAGNOSIS — I10 Essential (primary) hypertension: Secondary | ICD-10-CM | POA: Diagnosis not present

## 2023-10-24 DIAGNOSIS — E559 Vitamin D deficiency, unspecified: Secondary | ICD-10-CM | POA: Diagnosis not present

## 2023-10-24 DIAGNOSIS — J019 Acute sinusitis, unspecified: Secondary | ICD-10-CM

## 2023-10-24 DIAGNOSIS — E538 Deficiency of other specified B group vitamins: Secondary | ICD-10-CM

## 2023-10-24 DIAGNOSIS — R739 Hyperglycemia, unspecified: Secondary | ICD-10-CM

## 2023-10-24 DIAGNOSIS — Z23 Encounter for immunization: Secondary | ICD-10-CM

## 2023-10-24 MED ORDER — DOXYCYCLINE HYCLATE 100 MG PO TABS: 100.0000 mg | ORAL_TABLET | Freq: Two times a day (BID) | ORAL | 0 refills | Status: DC

## 2023-10-24 NOTE — Progress Notes (Signed)
Patient ID: Ellen Fernandez, female   DOB: April 08, 1971, 53 y.o.   MRN: [id number]        Chief Complaint: follow up sinusitis, allergies, htn, low vit d       HPI:  Ellen Fernandez is a 53 y.o. female  Here with 2-3 days acute onset fever, facial pain, pressure, headache, general weakness and malaise, and greenish d/c, with mild ST and cough, but pt denies chest pain, wheezing, increased sob or doe, orthopnea, PND, increased LE swelling, palpitations, dizziness or syncope.  Does have several wks ongoing nasal allergy symptoms with clearish congestion, itch and sneezing, without fever, pain, ST, cough, swelling or wheezing.   Pt denies polydipsia, polyuria, or new focal neuro s/s.    Pt denies wt loss, night sweats, loss of appetite, or other constitutional symptoms  Due for flu shot Wt Readings from Last 3 Encounters:  10/24/23 220 lb (99.8 kg)  09/10/23 217 lb (98.4 kg)  08/20/23 224 lb (101.6 kg)   BP Readings from Last 3 Encounters:  10/24/23 118/70  09/10/23 130/80  08/20/23 126/78         Past Medical History:  Diagnosis Date   Asthma    GERD (gastroesophageal reflux disease)    HTN (hypertension)    Hyperlipidemia    Past Surgical History:  Procedure Laterality Date   ABDOMINAL HYSTERECTOMY     partial small bowel resection  2017   complication of umbilical hernia repair   TUBAL LIGATION     UMBILICAL HERNIA REPAIR      reports that she has never smoked. She has never used smokeless tobacco. She reports that she does not currently use alcohol. She reports that she does not use drugs. family history includes Cancer in her father; Hypertension in her mother; Rashes / Skin problems in her daughter; Rheum arthritis in her daughter. Allergies  Allergen Reactions   Crestor [Rosuvastatin] Other (See Comments)    Joint pain   Lipitor [Atorvastatin] Other (See Comments)    Muscle pain   Sulfonamide Derivatives Hives   Current Outpatient Medications on File Prior to Visit  Medication Sig  Dispense Refill   albuterol (VENTOLIN HFA) 108 (90 Base) MCG/ACT inhaler Inhale 2 puffs into the lungs every 6 (six) hours as needed for wheezing or shortness of breath. 18 g 11   amLODipine (NORVASC) 10 MG tablet Take 1 tablet (10 mg total) by mouth daily. 90 tablet 3   diphenoxylate-atropine (LOMOTIL) 2.5-0.025 MG tablet Take 1 tablet by mouth 4 (four) times daily as needed for diarrhea or loose stools. 30 tablet 0   ezetimibe (ZETIA) 10 MG tablet Take 1 tablet (10 mg total) by mouth daily. 90 tablet 3   fluticasone (FLONASE) 50 MCG/ACT nasal spray one spray by Both Nostrils route daily. 16 g 11   hydrochlorothiazide (MICROZIDE) 12.5 MG capsule TAKE 1 CAPSULE(12.5 MG) BY MOUTH DAILY 90 capsule 3   ibuprofen (ADVIL,MOTRIN) 800 MG tablet Take 1 tablet (800 mg total) by mouth daily as needed for moderate pain. 90 tablet 3   meloxicam (MOBIC) 15 MG tablet TAKE 1 TABLET BY MOUTH EVERY DAY 90 tablet 1   omeprazole (PRILOSEC) 40 MG capsule TAKE 1 CAPSULE BY MOUTH EVERY DAY 90 capsule 3   ondansetron (ZOFRAN) 4 MG tablet Take 1 tablet (4 mg total) by mouth every 8 (eight) hours as needed for nausea or vomiting. 40 tablet 0   ondansetron (ZOFRAN-ODT) 4 MG disintegrating tablet Take 1 tablet (4 mg total) by mouth every  8 (eight) hours as needed. 30 tablet 1   Plecanatide (TRULANCE) 3 MG TABS 1 tab by mouth once daily as needed 90 tablet 3   potassium chloride (KLOR-CON M) 10 MEQ tablet Take 2 tablets (20 mEq total) by mouth daily. 180 tablet 3   potassium chloride (KLOR-CON) 10 MEQ tablet Take 2 tablets (20 mEq total) by mouth daily. 180 tablet 1   Semaglutide-Weight Management (WEGOVY) 0.5 MG/0.5ML SOAJ Inject 0.5 mg into the skin once a week. 2 mL 11   zolpidem (AMBIEN) 10 MG tablet TAKE 1 TABLET(10 MG) BY MOUTH AT BEDTIME AS NEEDED FOR SLEEP 90 tablet 1   No current facility-administered medications on file prior to visit.        ROS:  All others reviewed and negative.  Objective        PE:  BP  118/70 (BP Location: Right Arm, Patient Position: Sitting, Cuff Size: Normal)   Pulse 95   Temp 98.1 F (36.7 C) (Oral)   Ht 5\' 2"  (1.575 m)   Wt 220 lb (99.8 kg)   SpO2 96%   BMI 40.24 kg/m                 Constitutional: Pt appears mild ill               HENT: Head: NCAT.                Right Ear: External ear normal.                 Left Ear: External ear normal. Bilat tm's with mild erythema.  Max sinus areas mild tender.  Pharynx with mild erythema, no exudate                Eyes: . Pupils are equal, round, and reactive to light. Conjunctivae and EOM are normal               Nose: without d/c or deformity               Neck: Neck supple. Gross normal ROM               Cardiovascular: Normal rate and regular rhythm.                 Pulmonary/Chest: Effort normal and breath sounds without rales or wheezing.                               Neurological: Pt is alert. At baseline orientation, motor grossly intact               Skin: Skin is warm. No rashes, no other new lesions, LE edema - none               Psychiatric: Pt behavior is normal without agitation   Micro: none  Cardiac tracings I have personally interpreted today:  none  Pertinent Radiological findings (summarize): none   Lab Results  Component Value Date   WBC 11.8 (H) 07/02/2023   HGB 14.4 07/02/2023   HCT 44.1 07/02/2023   PLT 325.0 07/02/2023   GLUCOSE 87 07/02/2023   CHOL 298 (H) 10/11/2022   TRIG 99.0 10/11/2022   HDL 74.20 10/11/2022   LDLCALC 204 (H) 10/11/2022   ALT 17 07/02/2023   AST 17 07/02/2023   NA 139 07/02/2023   K 3.5 07/02/2023   CL 99 07/02/2023   CREATININE 0.95 09/10/2023  BUN 15 09/10/2023   CO2 28 07/02/2023   TSH 2.17 10/11/2022   HGBA1C 5.8 10/11/2022   Assessment/Plan:  Ellen Fernandez is a 53 y.o. Black or African American [2] female with  has a past medical history of Asthma, GERD (gastroesophageal reflux disease), HTN (hypertension), and Hyperlipidemia.  Acute sinus  infection Mild to mod, for antibx course doxycycline 100 bid,  to f/u any worsening symptoms or concerns  Allergic rhinitis Mild to mod, for restart flonase asd, to f/u any worsening symptoms or concerns  HTN (hypertension) BP Readings from Last 3 Encounters:  10/24/23 118/70  09/10/23 130/80  08/20/23 126/78   Stable, pt to continue medical treatment norvasc 10 every day, hct 12.5 qd   Vitamin D deficiency Last vitamin D Lab Results  Component Value Date   VD25OH 26.03 (L) 10/11/2022   Low, to start oral replacement  Followup: Return in about 6 months (around 04/22/2024).  Oliver Barre, MD 10/26/2023 9:39 PM [facility]

## 2023-10-24 NOTE — Patient Instructions (Signed)
Please take all new medication as prescribed  - the antibiotic  Please continue all other medications as before, and refills have been done if requested.  Please have the pharmacy call with any other refills you may need.  Please keep your appointments with your specialists as you may have planned  Please make an Appointment to return in 6 months, or sooner if needed, also with Lab Appointment for testing done 3-5 days before at the FIRST FLOOR Lab (so this is for TWO appointments - please see the scheduling desk as you leave)

## 2023-10-26 ENCOUNTER — Encounter: Payer: Self-pay | Admitting: Internal Medicine

## 2023-10-26 NOTE — Assessment & Plan Note (Signed)
 Last vitamin D Lab Results  Component Value Date   VD25OH 26.03 (L) 10/11/2022   Low, to start oral replacement

## 2023-10-26 NOTE — Assessment & Plan Note (Signed)
BP Readings from Last 3 Encounters:  10/24/23 118/70  09/10/23 130/80  08/20/23 126/78   Stable, pt to continue medical treatment norvasc 10 every day, hct 12.5 qd

## 2023-10-26 NOTE — Assessment & Plan Note (Signed)
Mild to mod, for restart flonase asd, to f/u any worsening symptoms or concerns

## 2023-10-26 NOTE — Assessment & Plan Note (Signed)
 Mild to mod, for antibx course doxycycline 100 bid,  to f/u any worsening symptoms or concerns

## 2023-10-26 NOTE — Addendum Note (Signed)
Addended by: Corwin Levins on: 10/26/2023 09:41 PM   Modules accepted: Orders

## 2023-10-27 ENCOUNTER — Other Ambulatory Visit: Payer: Self-pay | Admitting: Internal Medicine

## 2023-10-28 ENCOUNTER — Other Ambulatory Visit: Payer: Self-pay

## 2023-11-06 ENCOUNTER — Telehealth: Payer: Self-pay

## 2023-11-06 NOTE — Telephone Encounter (Signed)
 Patient did not call back to reschedule after No Show Pre Visit.  Pre visit and Colonoscopy were cancelled and No Show letter was sent

## 2023-11-06 NOTE — Telephone Encounter (Signed)
 Called patient and left a message stating that I had tried to reach her for the pre visit and that it was mandatory to having her colonoscopy. Message stated that she needed to call back today and her pre visit or the endo/colon would be cancelled.

## 2023-11-06 NOTE — Telephone Encounter (Signed)
 Called for the pre visit, but there was no answer.  Left a message that I was trying to reach her for the pre visit.  Will call back in 5 min

## 2023-11-14 ENCOUNTER — Encounter: Payer: BC Managed Care – PPO | Admitting: Gastroenterology

## 2023-11-21 ENCOUNTER — Other Ambulatory Visit: Payer: Self-pay | Admitting: Internal Medicine

## 2023-11-21 ENCOUNTER — Other Ambulatory Visit: Payer: Self-pay

## 2023-12-30 DIAGNOSIS — I10 Essential (primary) hypertension: Secondary | ICD-10-CM | POA: Insufficient documentation

## 2024-01-31 ENCOUNTER — Encounter: Payer: Self-pay | Admitting: Internal Medicine

## 2024-01-31 MED ORDER — FLUTICASONE PROPIONATE 50 MCG/ACT NA SUSP: NASAL | 11 refills | Status: AC

## 2024-01-31 MED ORDER — LEVOCETIRIZINE DIHYDROCHLORIDE 5 MG PO TABS: 5.0000 mg | ORAL_TABLET | Freq: Every evening | ORAL | 1 refills | Status: AC

## 2024-02-04 ENCOUNTER — Other Ambulatory Visit: Payer: Self-pay | Admitting: Internal Medicine

## 2024-02-04 MED ORDER — ZOLPIDEM TARTRATE 10 MG PO TABS: ORAL_TABLET | ORAL | 1 refills | Status: DC

## 2024-02-04 NOTE — Telephone Encounter (Signed)
 Copied from CRM [id number]. Topic: Clinical - Medication Refill >> Feb 04, 2024 10:16 AM Jenice Mitts wrote: Medication:  zolpidem  (AMBIEN ) 10 MG tablet   Has the patient contacted their pharmacy? Yes (Agent: If no, request that the patient contact the pharmacy for the refill. If patient does not wish to contact the pharmacy document the reason why and proceed with request.) (Agent: If yes, when and what did the pharmacy advise?)  This is the patient's preferred pharmacy:  [facility][id number] Jonette Nestle, [place] - [address] AT [facility] OF [facility] & [address] [place] [address] Phone: [phone] Fax: [phone]  Is this the correct pharmacy for this prescription? Yes If no, delete pharmacy and type the correct one.   Has the prescription been filled recently? No  Is the patient out of the medication? Yes  Has the patient been seen for an appointment in the last year OR does the patient have an upcoming appointment? Yes  Can we respond through MyChart? Yes  Agent: Please be advised that Rx refills may take up to 3 business days. We ask that you follow-up with your pharmacy.

## 2024-04-03 ENCOUNTER — Telehealth (INDEPENDENT_AMBULATORY_CARE_PROVIDER_SITE_OTHER): Admitting: Internal Medicine

## 2024-04-03 DIAGNOSIS — I10 Essential (primary) hypertension: Secondary | ICD-10-CM

## 2024-04-03 DIAGNOSIS — J019 Acute sinusitis, unspecified: Secondary | ICD-10-CM

## 2024-04-03 DIAGNOSIS — E559 Vitamin D deficiency, unspecified: Secondary | ICD-10-CM | POA: Diagnosis not present

## 2024-04-03 DIAGNOSIS — J45909 Unspecified asthma, uncomplicated: Secondary | ICD-10-CM

## 2024-04-03 MED ORDER — HYDROCODONE BIT-HOMATROP MBR 5-1.5 MG/5ML PO SOLN: 5.0000 mL | Freq: Four times a day (QID) | ORAL | 0 refills | Status: AC | PRN

## 2024-04-03 MED ORDER — DOXYCYCLINE HYCLATE 100 MG PO TABS: 100.0000 mg | ORAL_TABLET | Freq: Two times a day (BID) | ORAL | 0 refills | Status: DC

## 2024-04-03 NOTE — Progress Notes (Signed)
 Patient ID: Ellen Fernandez, female   DOB: [birth year], 53 y.o.   MRN: [id number]  Virtual Visit via Video Note  I connected with Ellen Fernandez on April 03 2024 at  3:20 PM EDT by a video enabled telemedicine application and verified that I am speaking with the correct person using two identifiers.  Location of all participants today Patient: at home Provider: at office   I discussed the limitations of evaluation and management by telemedicine and the availability of in person appointments. The patient expressed understanding and agreed to proceed.  History of Present Illness:  Here with 2-3 days acute onset fever, facial pain, pressure, headache, general weakness and malaise, and greenish d/c, with mild ST and cough, but pt denies chest pain, wheezing, increased sob or doe, orthopnea, PND, increased LE swelling, palpitations, dizziness or syncope.  Pt denies polydipsia, polyuria, or new focal neuro s/s.    Pt denies fever, wt loss, night sweats, loss of appetite, or other constitutional symptoms   Past Medical History:  Diagnosis Date   Asthma    GERD (gastroesophageal reflux disease)    HTN (hypertension)    Hyperlipidemia    Past Surgical History:  Procedure Laterality Date   ABDOMINAL HYSTERECTOMY     partial small bowel resection  2017   complication of umbilical hernia repair   TUBAL LIGATION     UMBILICAL HERNIA REPAIR      reports that she has never smoked. She has never used smokeless tobacco. She reports that she does not currently use alcohol. She reports that she does not use drugs. family history includes Cancer in her father; Hypertension in her mother; Rashes / Skin problems in her daughter; Rheum arthritis in her daughter. Allergies  Allergen Reactions   Crestor  [Rosuvastatin ] Other (See Comments)    Joint pain   Lipitor [Atorvastatin ] Other (See Comments)    Muscle pain   Sulfonamide Derivatives Hives   Current Outpatient Medications on File Prior to Visit  Medication  Sig Dispense Refill   albuterol  (VENTOLIN  HFA) 108 (90 Base) MCG/ACT inhaler Inhale 2 puffs into the lungs every 6 (six) hours as needed for wheezing or shortness of breath. 18 g 11   amLODipine  (NORVASC ) 10 MG tablet Take 1 tablet (10 mg total) by mouth daily. 90 tablet 3   diphenoxylate -atropine  (LOMOTIL ) 2.5-0.025 MG tablet Take 1 tablet by mouth 4 (four) times daily as needed for diarrhea or loose stools. 30 tablet 0   ezetimibe  (ZETIA ) 10 MG tablet TAKE 1 TABLET BY MOUTH EVERY DAY 90 tablet 3   fluticasone  (FLONASE ) 50 MCG/ACT nasal spray one spray by Both Nostrils route daily. 16 g 11   hydrochlorothiazide  (MICROZIDE ) 12.5 MG capsule TAKE 1 CAPSULE(12.5 MG) BY MOUTH DAILY 90 capsule 3   ibuprofen  (ADVIL ,MOTRIN ) 800 MG tablet Take 1 tablet (800 mg total) by mouth daily as needed for moderate pain. 90 tablet 3   levocetirizine (XYZAL ) 5 MG tablet Take 1 tablet (5 mg total) by mouth every evening. 90 tablet 1   meloxicam  (MOBIC ) 15 MG tablet TAKE 1 TABLET BY MOUTH EVERY DAY 90 tablet 1   omeprazole  (PRILOSEC) 40 MG capsule TAKE 1 CAPSULE BY MOUTH EVERY DAY 90 capsule 3   ondansetron  (ZOFRAN ) 4 MG tablet Take 1 tablet (4 mg total) by mouth every 8 (eight) hours as needed for nausea or vomiting. 40 tablet 0   ondansetron  (ZOFRAN -ODT) 4 MG disintegrating tablet Take 1 tablet (4 mg total) by mouth every 8 (eight) hours as needed.  30 tablet 1   Plecanatide  (TRULANCE ) 3 MG TABS 1 tab by mouth once daily as needed 90 tablet 3   potassium chloride  (KLOR-CON  M) 10 MEQ tablet Take 2 tablets (20 mEq total) by mouth daily. 180 tablet 3   potassium chloride  (KLOR-CON ) 10 MEQ tablet Take 2 tablets (20 mEq total) by mouth daily. 180 tablet 1   Semaglutide -Weight Management (WEGOVY ) 0.5 MG/0.5ML SOAJ Inject 0.5 mg into the skin once a week. 2 mL 11   zolpidem  (AMBIEN ) 10 MG tablet TAKE 1 TABLET(10 MG) BY MOUTH AT BEDTIME AS NEEDED FOR SLEEP 90 tablet 1   No current facility-administered medications on file  prior to visit.    Observations/Objective: Alert, NAD, appropriate mood and affect, resps normal, cn 2-12 intact, moves all 4s, no visible rash or swelling Lab Results  Component Value Date   WBC 11.8 (H) 07/02/2023   HGB 14.4 07/02/2023   HCT 44.1 07/02/2023   PLT 325.0 07/02/2023   GLUCOSE 87 07/02/2023   CHOL 298 (H) 10/11/2022   TRIG 99.0 10/11/2022   HDL 74.20 10/11/2022   LDLCALC 204 (H) 10/11/2022   ALT 17 07/02/2023   AST 17 07/02/2023   NA 139 07/02/2023   K 3.5 07/02/2023   CL 99 07/02/2023   CREATININE 0.95 09/10/2023   BUN 15 09/10/2023   CO2 28 07/02/2023   TSH 2.17 10/11/2022   HGBA1C 5.8 10/11/2022   Assessment and Plan: See notes  Follow Up Instructions: See notes   I discussed the assessment and treatment plan with the patient. The patient was provided an opportunity to ask questions and all were answered. The patient agreed with the plan and demonstrated an understanding of the instructions.   The patient was advised to call back or seek an in-person evaluation if the symptoms worsen or if the condition fails to improve as anticipated.   Lynwood Rush, MD

## 2024-04-03 NOTE — Patient Instructions (Addendum)
 Please take all new medication as prescribed  You are given the work note today

## 2024-04-04 ENCOUNTER — Encounter: Payer: Self-pay | Admitting: Internal Medicine

## 2024-04-04 NOTE — Assessment & Plan Note (Signed)
 Last vitamin D Lab Results  Component Value Date   VD25OH 26.03 (L) 10/11/2022   Low, to start oral replacement

## 2024-04-04 NOTE — Assessment & Plan Note (Signed)
Stable overall, continue inhaler prn 

## 2024-04-04 NOTE — Assessment & Plan Note (Signed)
 BP Readings from Last 3 Encounters:  10/24/23 118/70  09/10/23 130/80  08/20/23 126/78   Stable, pt to continue medical treatment norvasc 10 every day, hct 12.5 qd

## 2024-04-04 NOTE — Assessment & Plan Note (Signed)
 Mild to mod, for antibx course doxycycline  100 bid course,  to f/u any worsening symptoms or concerns

## 2024-04-20 ENCOUNTER — Other Ambulatory Visit

## 2024-04-20 DIAGNOSIS — I10 Essential (primary) hypertension: Secondary | ICD-10-CM | POA: Diagnosis not present

## 2024-04-20 DIAGNOSIS — E538 Deficiency of other specified B group vitamins: Secondary | ICD-10-CM | POA: Diagnosis not present

## 2024-04-20 DIAGNOSIS — E559 Vitamin D deficiency, unspecified: Secondary | ICD-10-CM

## 2024-04-20 DIAGNOSIS — R739 Hyperglycemia, unspecified: Secondary | ICD-10-CM | POA: Diagnosis not present

## 2024-04-20 DIAGNOSIS — E7800 Pure hypercholesterolemia, unspecified: Secondary | ICD-10-CM

## 2024-04-20 LAB — URINALYSIS, ROUTINE W REFLEX MICROSCOPIC
Bilirubin Urine: NEGATIVE
Hgb urine dipstick: NEGATIVE
Ketones, ur: NEGATIVE
Nitrite: NEGATIVE
RBC / HPF: NONE SEEN (ref 0–?)
Specific Gravity, Urine: 1.01 (ref 1.000–1.030)
Total Protein, Urine: NEGATIVE
Urine Glucose: NEGATIVE
Urobilinogen, UA: 0.2 (ref 0.0–1.0)
pH: 6.5 (ref 5.0–8.0)

## 2024-04-20 LAB — CBC WITH DIFFERENTIAL/PLATELET
Basophils Absolute: 0 K/uL (ref 0.0–0.1)
Basophils Relative: 0.4 % (ref 0.0–3.0)
Eosinophils Absolute: 0.3 K/uL (ref 0.0–0.7)
Eosinophils Relative: 2.6 % (ref 0.0–5.0)
HCT: 41.6 % (ref 36.0–46.0)
Hemoglobin: 13.9 g/dL (ref 12.0–15.0)
Lymphocytes Relative: 23.2 % (ref 12.0–46.0)
Lymphs Abs: 2.5 K/uL (ref 0.7–4.0)
MCHC: 33.4 g/dL (ref 30.0–36.0)
MCV: 89.3 fl (ref 78.0–100.0)
Monocytes Absolute: 0.7 K/uL (ref 0.1–1.0)
Monocytes Relative: 6.7 % (ref 3.0–12.0)
Neutro Abs: 7.2 K/uL (ref 1.4–7.7)
Neutrophils Relative %: 67.1 % (ref 43.0–77.0)
Platelets: 318 K/uL (ref 150.0–400.0)
RBC: 4.65 Mil/uL (ref 3.87–5.11)
RDW: 14.9 % (ref 11.5–15.5)
WBC: 10.7 K/uL — ABNORMAL HIGH (ref 4.0–10.5)

## 2024-04-20 LAB — BASIC METABOLIC PANEL WITH GFR
BUN: 17 mg/dL (ref 6–23)
CO2: 31 meq/L (ref 19–32)
Calcium: 9.7 mg/dL (ref 8.4–10.5)
Chloride: 98 meq/L (ref 96–112)
Creatinine, Ser: 0.82 mg/dL (ref 0.40–1.20)
GFR: 81.36 mL/min (ref >60.00)
Glucose, Bld: 88 mg/dL (ref 70–99)
Potassium: 3.5 meq/L (ref 3.5–5.1)
Sodium: 137 meq/L (ref 135–145)

## 2024-04-20 LAB — HEPATIC FUNCTION PANEL
ALT: 12 U/L (ref 0–35)
AST: 14 U/L (ref 0–37)
Albumin: 4.3 g/dL (ref 3.5–5.2)
Alkaline Phosphatase: 105 U/L (ref 39–117)
Bilirubin, Direct: 0.1 mg/dL (ref 0.0–0.3)
Total Bilirubin: 0.3 mg/dL (ref 0.2–1.2)
Total Protein: 7.7 g/dL (ref 6.0–8.3)

## 2024-04-20 LAB — MICROALBUMIN / CREATININE URINE RATIO
Creatinine,U: 290.3 mg/dL
Microalb Creat Ratio: 6.3 mg/g (ref 0.0–30.0)
Microalb, Ur: 1.8 mg/dL (ref 0.0–1.9)

## 2024-04-20 LAB — LIPID PANEL
Cholesterol: 232 mg/dL — ABNORMAL HIGH (ref 0–200)
HDL: 61.6 mg/dL (ref >39.00)
LDL Cholesterol: 152 mg/dL — ABNORMAL HIGH (ref 0–99)
NonHDL: 170.65
Total CHOL/HDL Ratio: 4
Triglycerides: 91 mg/dL (ref 0.0–149.0)
VLDL: 18.2 mg/dL (ref 0.0–40.0)

## 2024-04-20 LAB — TSH: TSH: 0.77 u[IU]/mL (ref 0.35–5.50)

## 2024-04-20 LAB — VITAMIN B12: Vitamin B-12: 588 pg/mL (ref 211–911)

## 2024-04-20 LAB — HEMOGLOBIN A1C: Hgb A1c MFr Bld: 6 % (ref 4.6–6.5)

## 2024-04-20 LAB — VITAMIN D 25 HYDROXY (VIT D DEFICIENCY, FRACTURES): VITD: 100 ng/mL (ref 30.00–100.00)

## 2024-04-22 ENCOUNTER — Ambulatory Visit: Payer: BC Managed Care – PPO | Admitting: Internal Medicine

## 2024-04-22 ENCOUNTER — Encounter: Payer: Self-pay | Admitting: Internal Medicine

## 2024-04-22 VITALS — BP 122/76 | HR 82 | Temp 98.4°F | Ht 62.0 in | Wt 208.0 lb

## 2024-04-22 DIAGNOSIS — I10 Essential (primary) hypertension: Secondary | ICD-10-CM | POA: Diagnosis not present

## 2024-04-22 DIAGNOSIS — Z0001 Encounter for general adult medical examination with abnormal findings: Secondary | ICD-10-CM | POA: Diagnosis not present

## 2024-04-22 DIAGNOSIS — E538 Deficiency of other specified B group vitamins: Secondary | ICD-10-CM | POA: Diagnosis not present

## 2024-04-22 DIAGNOSIS — J019 Acute sinusitis, unspecified: Secondary | ICD-10-CM | POA: Diagnosis not present

## 2024-04-22 DIAGNOSIS — E559 Vitamin D deficiency, unspecified: Secondary | ICD-10-CM | POA: Diagnosis not present

## 2024-04-22 DIAGNOSIS — R739 Hyperglycemia, unspecified: Secondary | ICD-10-CM | POA: Diagnosis not present

## 2024-04-22 DIAGNOSIS — E785 Hyperlipidemia, unspecified: Secondary | ICD-10-CM

## 2024-04-22 DIAGNOSIS — Z1231 Encounter for screening mammogram for malignant neoplasm of breast: Secondary | ICD-10-CM

## 2024-04-22 MED ORDER — REPATHA SURECLICK 140 MG/ML ~~LOC~~ SOAJ
140.0000 mg | SUBCUTANEOUS | 3 refills | Status: AC
Start: 2024-04-22 — End: ?

## 2024-04-22 MED ORDER — POTASSIUM CHLORIDE ER 10 MEQ PO TBCR: 20.0000 meq | EXTENDED_RELEASE_TABLET | Freq: Every day | ORAL | 3 refills | Status: AC

## 2024-04-22 MED ORDER — CEPHALEXIN 500 MG PO CAPS: 500.0000 mg | ORAL_CAPSULE | Freq: Three times a day (TID) | ORAL | 0 refills | Status: DC

## 2024-04-22 MED ORDER — TRULANCE 3 MG PO TABS: ORAL_TABLET | ORAL | 3 refills | Status: AC

## 2024-04-22 NOTE — Patient Instructions (Addendum)
 Please have your Shingrix (shingles) shots done at your local pharmacy. And the Prevnar 20 shot as well  Please take all new medication as prescribed - the antibiotic  Please take all new medication as prescribed - the Repatha  shots for the cholesterol  Please continue all other medications as before, including to try the Trulance  again  Please have the pharmacy call with any other refills you may need.  Please continue your efforts at being more active, low cholesterol diet, and weight control.  You are otherwise up to date with prevention measures today.  Please keep your appointments with your specialists as you may have planned  You will be contacted regarding the referral for: mammogram  Please make an Appointment to return for your 1 year visit, or sooner if needed, with Lab testing by Appointment as well, to be done about 3-5 days before at the FIRST FLOOR Lab (so this is for TWO appointments - please see the scheduling desk as you leave)

## 2024-04-22 NOTE — Assessment & Plan Note (Signed)
 Mild to mod, for antibx course cephalexin , as did not tolerate doxy course with GI upset recently,  to f/u any worsening symptoms or concerns

## 2024-04-22 NOTE — Assessment & Plan Note (Signed)
 Lab Results  Component Value Date   LDLCALC 152 (H) 04/20/2024   Improved but uncontrolled, pt to add repatha  140 mg every 2 wks, continue zetia   10 every day , has been statin intolerant

## 2024-04-22 NOTE — Progress Notes (Signed)
 Patient ID: Ellen Fernandez, female   DOB: 10-09-1970, 53 y.o.   MRN: [id number]         Chief Complaint:: wellness exam and sinusitis, hld, constipation, low vit d       HPI:  Ellen Fernandez is a 53 y.o. female here for wellness exam; due for mammogram, o/w up to date                        Also Pt denies chest pain, increased sob or doe, wheezing, orthopnea, PND, increased LE swelling, palpitations, dizziness or syncope.   Pt denies polydipsia, polyuria, or new focal neuro s/s.    Pt denies fever, wt loss, night sweats, loss of appetite, or other constitutional symptoms  Denies worsening reflux, abd pain, dysphagia, n/v, blood but does have ongoing worsening constipation, trulance  not covered by insurance last time but willing to try again, has not improved well with miralax and linzess .   Here with 2-3 days acute onset fever, facial pain, pressure, headache, general weakness and malaise, and greenish d/c, with mild ST and cough.  Willing to start repatha  for hld.     Wt Readings from Last 3 Encounters:  04/22/24 208 lb (94.3 kg)  10/24/23 220 lb (99.8 kg)  09/10/23 217 lb (98.4 kg)   BP Readings from Last 3 Encounters:  04/22/24 122/76  10/24/23 118/70  09/10/23 130/80   Immunization History  Administered Date(s) Administered   Influenza, Seasonal, Injecte, Preservative Fre 10/24/2023   Td 09/10/2005   Tdap 02/01/2016   Health Maintenance Due  Topic Date Due   Pneumococcal Vaccine: 19-49 Years (1 of 2 - PCV) Never done   Pneumococcal Vaccine: 50+ Years (1 of 2 - PCV) Never done   Colonoscopy  Never done   Zoster Vaccines- Shingrix (1 of 2) Never done   MAMMOGRAM  06/29/2023   INFLUENZA VACCINE  04/10/2024      Past Medical History:  Diagnosis Date   Allergy    Asthma    GERD (gastroesophageal reflux disease)    HTN (hypertension)    Hyperlipidemia    Past Surgical History:  Procedure Laterality Date   ABDOMINAL HYSTERECTOMY     HERNIA REPAIR     partial small bowel  resection  2017   complication of umbilical hernia repair   SMALL INTESTINE SURGERY     TUBAL LIGATION     UMBILICAL HERNIA REPAIR      reports that she has never smoked. She has never used smokeless tobacco. She reports that she does not currently use alcohol. She reports that she does not use drugs. family history includes Cancer in her father; Hypertension in her mother; Rashes / Skin problems in her daughter; Rheum arthritis in her daughter. Allergies  Allergen Reactions   Crestor  [Rosuvastatin ] Other (See Comments)    Joint pain   Doxycycline  Nausea And Vomiting   Lipitor [Atorvastatin ] Other (See Comments)    Muscle pain   Sulfonamide Derivatives Hives   Current Outpatient Medications on File Prior to Visit  Medication Sig Dispense Refill   albuterol  (VENTOLIN  HFA) 108 (90 Base) MCG/ACT inhaler Inhale 2 puffs into the lungs every 6 (six) hours as needed for wheezing or shortness of breath. 18 g 11   amLODipine  (NORVASC ) 10 MG tablet Take 1 tablet (10 mg total) by mouth daily. 90 tablet 3   diphenoxylate -atropine  (LOMOTIL ) 2.5-0.025 MG tablet Take 1 tablet by mouth 4 (four) times daily as needed for diarrhea or  loose stools. 30 tablet 0   ezetimibe  (ZETIA ) 10 MG tablet TAKE 1 TABLET BY MOUTH EVERY DAY 90 tablet 3   fluticasone  (FLONASE ) 50 MCG/ACT nasal spray one spray by Both Nostrils route daily. 16 g 11   hydrochlorothiazide  (MICROZIDE ) 12.5 MG capsule TAKE 1 CAPSULE(12.5 MG) BY MOUTH DAILY 90 capsule 3   ibuprofen  (ADVIL ,MOTRIN ) 800 MG tablet Take 1 tablet (800 mg total) by mouth daily as needed for moderate pain. 90 tablet 3   levocetirizine (XYZAL ) 5 MG tablet Take 1 tablet (5 mg total) by mouth every evening. 90 tablet 1   meloxicam  (MOBIC ) 15 MG tablet TAKE 1 TABLET BY MOUTH EVERY DAY 90 tablet 1   omeprazole  (PRILOSEC) 40 MG capsule TAKE 1 CAPSULE BY MOUTH EVERY DAY 90 capsule 3   ondansetron  (ZOFRAN ) 4 MG tablet Take 1 tablet (4 mg total) by mouth every 8 (eight) hours as  needed for nausea or vomiting. 40 tablet 0   ondansetron  (ZOFRAN -ODT) 4 MG disintegrating tablet Take 1 tablet (4 mg total) by mouth every 8 (eight) hours as needed. 30 tablet 1   potassium chloride  (KLOR-CON  M) 10 MEQ tablet Take 2 tablets (20 mEq total) by mouth daily. 180 tablet 3   Semaglutide -Weight Management (WEGOVY ) 0.5 MG/0.5ML SOAJ Inject 0.5 mg into the skin once a week. 2 mL 11   zolpidem  (AMBIEN ) 10 MG tablet TAKE 1 TABLET(10 MG) BY MOUTH AT BEDTIME AS NEEDED FOR SLEEP 90 tablet 1   No current facility-administered medications on file prior to visit.        ROS:  All others reviewed and negative.  Objective        PE:  BP 122/76   Pulse 82   Temp 98.4 F (36.9 C)   Ht 5' 2 (1.575 m)   Wt 208 lb (94.3 kg)   SpO2 99%   BMI 38.04 kg/m                 Constitutional: Pt appears in NAD               HENT: Head: NCAT.                Right Ear: External ear normal.                 Left Ear: External ear normal. Bilat tm's with mild erythema.  Max sinus areas mild tender.  Pharynx with mild erythema, no exudate               Eyes: . Pupils are equal, round, and reactive to light. Conjunctivae and EOM are normal               Nose: without d/c or deformity               Neck: Neck supple. Gross normal ROM               Cardiovascular: Normal rate and regular rhythm.                 Pulmonary/Chest: Effort normal and breath sounds without rales or wheezing.                Abd:  Soft, NT, ND, + BS, no organomegaly               Neurological: Pt is alert. At baseline orientation, motor grossly intact               Skin: Skin is  warm. No rashes, no other new lesions, LE edema - none               Psychiatric: Pt behavior is normal without agitation   Micro: none  Cardiac tracings I have personally interpreted today:  none  Pertinent Radiological findings (summarize): none   Lab Results  Component Value Date   WBC 10.7 (H) 04/20/2024   HGB 13.9 04/20/2024   HCT 41.6  04/20/2024   PLT 318.0 04/20/2024   GLUCOSE 88 04/20/2024   CHOL 232 (H) 04/20/2024   TRIG 91.0 04/20/2024   HDL 61.60 04/20/2024   LDLCALC 152 (H) 04/20/2024   ALT 12 04/20/2024   AST 14 04/20/2024   NA 137 04/20/2024   K 3.5 04/20/2024   CL 98 04/20/2024   CREATININE 0.82 04/20/2024   BUN 17 04/20/2024   CO2 31 04/20/2024   TSH 0.77 04/20/2024   HGBA1C 6.0 04/20/2024   MICROALBUR 1.8 04/20/2024   Assessment/Plan:  Ellen Fernandez is a 53 y.o. Black or African American [2] female with  has a past medical history of Allergy, Asthma, GERD (gastroesophageal reflux disease), HTN (hypertension), and Hyperlipidemia.  Encounter for well adult exam with abnormal findings Age and sex appropriate education and counseling updated with regular exercise and diet Referrals for preventative services - for mammogram screening Immunizations addressed - none needed Smoking counseling  - none needed Evidence for depression or other mood disorder - none significant Most recent labs reviewed. I have personally reviewed and have noted: 1) the patient's medical and social history 2) The patient's current medications and supplements 3) The patient's height, weight, and BMI have been recorded in the chart   Vitamin D  deficiency Last vitamin D  Lab Results  Component Value Date   VD25OH 100.00 04/20/2024   Stable, cont oral replacement   HTN (hypertension) BP Readings from Last 3 Encounters:  04/22/24 122/76  10/24/23 118/70  09/10/23 130/80   Stable, pt to continue medical treatment norvasc  10 every day, hct 12.5 qd   Acute sinus infection Mild to mod, for antibx course cephalexin , as did not tolerate doxy course with GI upset recently,  to f/u any worsening symptoms or concerns  Dyslipidemia Lab Results  Component Value Date   LDLCALC 152 (H) 04/20/2024   Improved but uncontrolled, pt to add repatha  140 mg every 2 wks, continue zetia   10 every day , has been statin  intolerant  Followup: Return in about 1 year (around 04/22/2025).  Lynwood Rush, MD 04/22/2024 7:05 PM [facility]  Primary Care - [facility] Internal Medicine

## 2024-04-22 NOTE — Assessment & Plan Note (Signed)
 Age and sex appropriate education and counseling updated with regular exercise and diet Referrals for preventative services - for mammogram screening Immunizations addressed - none needed Smoking counseling  - none needed Evidence for depression or other mood disorder - none significant Most recent labs reviewed. I have personally reviewed and have noted: 1) the patient's medical and social history 2) The patient's current medications and supplements 3) The patient's height, weight, and BMI have been recorded in the chart

## 2024-04-22 NOTE — Addendum Note (Signed)
 Addended by: NORLEEN LYNWOOD ORN on: 04/22/2024 07:07 PM   Modules accepted: Orders

## 2024-04-22 NOTE — Assessment & Plan Note (Signed)
 Last vitamin D  Lab Results  Component Value Date   VD25OH 100.00 04/20/2024   Stable, cont oral replacement

## 2024-04-22 NOTE — Assessment & Plan Note (Signed)
 BP Readings from Last 3 Encounters:  04/22/24 122/76  10/24/23 118/70  09/10/23 130/80   Stable, pt to continue medical treatment norvasc  10 every day, hct 12.5 qd

## 2024-04-24 ENCOUNTER — Other Ambulatory Visit (HOSPITAL_COMMUNITY): Payer: Self-pay

## 2024-04-24 ENCOUNTER — Telehealth: Payer: Self-pay

## 2024-04-24 NOTE — Telephone Encounter (Signed)
 Pharmacy Patient Advocate Encounter   Received notification from CoverMyMeds that prior authorization for Trulance  3MG  tablets is required/requested.   Insurance verification completed.   The patient is insured through [facility] .   Per test claim: The current 30 day co-pay is, $103.00.  No PA needed at this time. This test claim was processed through [facility]- copay amounts may vary at other pharmacies due to pharmacy/plan contracts, or as the patient moves through the different stages of their insurance plan.

## 2024-05-06 ENCOUNTER — Encounter: Payer: Self-pay | Admitting: Internal Medicine

## 2024-06-11 NOTE — Telephone Encounter (Signed)
 Error

## 2024-07-01 ENCOUNTER — Other Ambulatory Visit: Payer: Self-pay | Admitting: Internal Medicine

## 2024-07-01 NOTE — Telephone Encounter (Signed)
 Copied from CRM [id number]. Topic: Clinical - Medication Refill >> Jul 01, 2024  2:17 PM Taleah C wrote: Medication: zetia , amlodipine , ezetimibe , omeprazole , hydrochlorothiazide    Has the patient contacted their pharmacy? Yes They claimed they reached out for refills  This is the patient's preferred pharmacy:  [facility] #[id number] [place] [address], [place] - [address] AT [facility] OF [facility] & [address] [place] [address] Phone: [phone] Fax: [phone]  Is this the correct pharmacy for this prescription? Yes If no, delete pharmacy and type the correct one.   Has the prescription been filled recently? No  Is the patient out of the medication? Yes  Has the patient been seen for an appointment in the last year OR does the patient have an upcoming appointment? Yes  Can we respond through MyChart? Yes  Agent: Please be advised that Rx refills may take up to 3 business days. We ask that you follow-up with your pharmacy.

## 2024-07-03 MED ORDER — OMEPRAZOLE 40 MG PO CPDR: DELAYED_RELEASE_CAPSULE | ORAL | 3 refills | Status: AC

## 2024-07-03 MED ORDER — EZETIMIBE 10 MG PO TABS: 10.0000 mg | ORAL_TABLET | Freq: Every day | ORAL | 3 refills | Status: AC

## 2024-07-03 MED ORDER — AMLODIPINE BESYLATE 10 MG PO TABS: 10.0000 mg | ORAL_TABLET | Freq: Every day | ORAL | 3 refills | Status: AC

## 2024-07-03 MED ORDER — HYDROCHLOROTHIAZIDE 12.5 MG PO CAPS: ORAL_CAPSULE | ORAL | 3 refills | Status: AC

## 2024-07-15 ENCOUNTER — Ambulatory Visit: Admitting: Internal Medicine

## 2024-07-15 ENCOUNTER — Encounter: Payer: Self-pay | Admitting: Internal Medicine

## 2024-07-15 VITALS — BP 126/80 | HR 105 | Temp 98.2°F | Ht 62.0 in | Wt 197.0 lb

## 2024-07-15 DIAGNOSIS — M545 Low back pain, unspecified: Secondary | ICD-10-CM | POA: Insufficient documentation

## 2024-07-15 DIAGNOSIS — L509 Urticaria, unspecified: Secondary | ICD-10-CM | POA: Diagnosis not present

## 2024-07-15 DIAGNOSIS — E559 Vitamin D deficiency, unspecified: Secondary | ICD-10-CM | POA: Diagnosis not present

## 2024-07-15 DIAGNOSIS — I10 Essential (primary) hypertension: Secondary | ICD-10-CM

## 2024-07-15 MED ORDER — PREDNISONE 10 MG PO TABS: ORAL_TABLET | ORAL | 0 refills | Status: AC

## 2024-07-15 MED ORDER — MELOXICAM 15 MG PO TABS: 15.0000 mg | ORAL_TABLET | Freq: Every day | ORAL | 1 refills | Status: AC

## 2024-07-15 MED ORDER — CYCLOBENZAPRINE HCL 5 MG PO TABS
5.0000 mg | ORAL_TABLET | Freq: Three times a day (TID) | ORAL | 1 refills | Status: AC | PRN
Start: 2024-07-15 — End: ?

## 2024-07-15 NOTE — Assessment & Plan Note (Addendum)
 With mild to mod persistent pain - for flexeril 5 tid prn, prednisone  taper, and mobic  15 mg every day prn, and encouraged to call for the PT ordered per workmans comp

## 2024-07-15 NOTE — Assessment & Plan Note (Signed)
 Now resolved, pt believes may have been soy related but not sure - ok for allergy referral as requested

## 2024-07-15 NOTE — Progress Notes (Signed)
 Patient ID: Ellen Fernandez, female   DOB: [birth year], 53 y.o.   MRN: [id number]        Chief Complaint: follow up lower back pain/coccyx pain and left lateral hip soreness, hives with known allergy to sulfa, htn, low vit d       HPI:  Ellen Fernandez is a 53 y.o. female here with c/o fall off a rolling chair oct 3 at work, seen per pitney bowes PA with multiple xray including coccyx, lumbar pelvis and hips per pt.  Has known lumbar DJD and bilateral hip DJD.  No fractures by report.  Did have large contusion to left lateral hip initially now improved but still some sore to touch. Also had intense tingling numbness to most of LLE for 3 days now resolved  No LLE weakness or other falls.  Tx with flexeril prn but now out. No further falls or injury.  Pain now 5/10 to lumbar coccyx and less to left lateral hip area. Pt has been referred to PT but not yet called to set up the treatment     Pt unfortunately just days prior also had hives episode after eating soy with sulfa In it, had marked hives to whole body except face and neck, better with benadryl, and after fall added a few prednisone  tabs that resolved the rash after 3 days and maybe helped the back.  Pt is requesting referral allergy.         Wt Readings from Last 3 Encounters:  07/15/24 197 lb (89.4 kg)  04/22/24 208 lb (94.3 kg)  10/24/23 220 lb (99.8 kg)   BP Readings from Last 3 Encounters:  07/15/24 126/80  04/22/24 122/76  10/24/23 118/70         Past Medical History:  Diagnosis Date   Allergy    Asthma    GERD (gastroesophageal reflux disease)    HTN (hypertension)    Hyperlipidemia    Past Surgical History:  Procedure Laterality Date   ABDOMINAL HYSTERECTOMY     HERNIA REPAIR     partial small bowel resection  2017   complication of umbilical hernia repair   SMALL INTESTINE SURGERY     TUBAL LIGATION     UMBILICAL HERNIA REPAIR      reports that she has never smoked. She has never used smokeless tobacco. She reports that she  does not currently use alcohol. She reports that she does not use drugs. family history includes Cancer in her father; Hypertension in her mother; Rashes / Skin problems in her daughter; Rheum arthritis in her daughter. Allergies  Allergen Reactions   Crestor  [Rosuvastatin ] Other (See Comments)    Joint pain   Doxycycline  Nausea And Vomiting   Lipitor [Atorvastatin ] Other (See Comments)    Muscle pain   Sulfonamide Derivatives Hives   Current Outpatient Medications on File Prior to Visit  Medication Sig Dispense Refill   albuterol  (VENTOLIN  HFA) 108 (90 Base) MCG/ACT inhaler Inhale 2 puffs into the lungs every 6 (six) hours as needed for wheezing or shortness of breath. 18 g 11   amLODipine  (NORVASC ) 10 MG tablet Take 1 tablet (10 mg total) by mouth daily. 90 tablet 3   diphenoxylate -atropine  (LOMOTIL ) 2.5-0.025 MG tablet Take 1 tablet by mouth 4 (four) times daily as needed for diarrhea or loose stools. 30 tablet 0   Evolocumab  (REPATHA  SURECLICK) 140 MG/ML SOAJ Inject 140 mg into the skin every 14 (fourteen) days. 6 mL 3   ezetimibe  (ZETIA ) 10 MG tablet Take  1 tablet (10 mg total) by mouth daily. 90 tablet 3   fluticasone  (FLONASE ) 50 MCG/ACT nasal spray one spray by Both Nostrils route daily. 16 g 11   hydrochlorothiazide  (MICROZIDE ) 12.5 MG capsule TAKE 1 CAPSULE(12.5 MG) BY MOUTH DAILY 90 capsule 3   ibuprofen  (ADVIL ,MOTRIN ) 800 MG tablet Take 1 tablet (800 mg total) by mouth daily as needed for moderate pain. 90 tablet 3   levocetirizine (XYZAL ) 5 MG tablet Take 1 tablet (5 mg total) by mouth every evening. 90 tablet 1   omeprazole  (PRILOSEC) 40 MG capsule TAKE 1 CAPSULE BY MOUTH EVERY DAY 90 capsule 3   ondansetron  (ZOFRAN ) 4 MG tablet Take 1 tablet (4 mg total) by mouth every 8 (eight) hours as needed for nausea or vomiting. 40 tablet 0   ondansetron  (ZOFRAN -ODT) 4 MG disintegrating tablet Take 1 tablet (4 mg total) by mouth every 8 (eight) hours as needed. 30 tablet 1   Plecanatide   (TRULANCE ) 3 MG TABS 1 tab by mouth once daily as needed 90 tablet 3   potassium chloride  (KLOR-CON  M) 10 MEQ tablet Take 2 tablets (20 mEq total) by mouth daily. 180 tablet 3   potassium chloride  (KLOR-CON ) 10 MEQ tablet Take 2 tablets (20 mEq total) by mouth daily. 180 tablet 3   Semaglutide -Weight Management (WEGOVY ) 0.5 MG/0.5ML SOAJ Inject 0.5 mg into the skin once a week. 2 mL 11   zolpidem  (AMBIEN ) 10 MG tablet TAKE 1 TABLET(10 MG) BY MOUTH AT BEDTIME AS NEEDED FOR SLEEP 90 tablet 1   No current facility-administered medications on file prior to visit.        ROS:  All others reviewed and negative.  Objective        PE:  BP 126/80 (BP Location: Right Arm, Patient Position: Sitting, Cuff Size: Normal)   Pulse (!) 105   Temp 98.2 F (36.8 C) (Oral)   Ht 5' 2 (1.575 m)   Wt 197 lb (89.4 kg)   SpO2 98%   BMI 36.03 kg/m                 Constitutional: Pt appears in NAD               HENT: Head: NCAT.                Right Ear: External ear normal.                 Left Ear: External ear normal.                Eyes: . Pupils are equal, round, and reactive to light. Conjunctivae and EOM are normal               Nose: without d/c or deformity               Neck: Neck supple. Gross normal ROM               Cardiovascular: Normal rate and regular rhythm.                 Pulmonary/Chest: Effort normal and breath sounds without rales or wheezing.                Abd:  Soft, NT, ND, + BS, no organomegaly               Neurological: Pt is alert. At baseline orientation, motor grossly intact, cn 2-12 intact  Skin: Skin is warm. No rashes, no other new lesions, LE edema - none, has mild tender over left lateral trochanter, has mild low midline lumbar tender but moderate bilateral paraspinal spasm               Psychiatric: Pt behavior is normal without agitation   Micro: none  Cardiac tracings I have personally interpreted today:  none  Pertinent Radiological findings  (summarize): none   Lab Results  Component Value Date   WBC 10.7 (H) 04/20/2024   HGB 13.9 04/20/2024   HCT 41.6 04/20/2024   PLT 318.0 04/20/2024   GLUCOSE 88 04/20/2024   CHOL 232 (H) 04/20/2024   TRIG 91.0 04/20/2024   HDL 61.60 04/20/2024   LDLCALC 152 (H) 04/20/2024   ALT 12 04/20/2024   AST 14 04/20/2024   NA 137 04/20/2024   K 3.5 04/20/2024   CL 98 04/20/2024   CREATININE 0.82 04/20/2024   BUN 17 04/20/2024   CO2 31 04/20/2024   TSH 0.77 04/20/2024   HGBA1C 6.0 04/20/2024   MICROALBUR 1.8 04/20/2024   Assessment/Plan:  Cheyrl Buley is a 53 y.o. Black or African American [2] female with  has a past medical history of Allergy, Asthma, GERD (gastroesophageal reflux disease), HTN (hypertension), and Hyperlipidemia.  Vitamin D  deficiency Last vitamin D  Lab Results  Component Value Date   VD25OH 100.00 04/20/2024   Stable, cont oral replacement   Low back pain With mild to mod persistent pain - for flexeril 5 tid prn, prednisone  taper, and mobic  15 mg every day prn, and encouraged to call for the PT ordered per workmans comp  HTN (hypertension) BP Readings from Last 3 Encounters:  07/15/24 126/80  04/22/24 122/76  10/24/23 118/70   Stable, pt to continue medical treatment norvasc  10 every day, hct 12.5 qd   Hives Now resolved, pt believes may have been soy related but not sure - ok for allergy referral as requested  Followup: Return if symptoms worsen or fail to improve.  Lynwood Rush, MD 07/15/2024 6:40 PM [facility]  Primary Care - [facility]

## 2024-07-15 NOTE — Assessment & Plan Note (Signed)
 Last vitamin D  Lab Results  Component Value Date   VD25OH 100.00 04/20/2024   Stable, cont oral replacement

## 2024-07-15 NOTE — Assessment & Plan Note (Signed)
 BP Readings from Last 3 Encounters:  07/15/24 126/80  04/22/24 122/76  10/24/23 118/70   Stable, pt to continue medical treatment norvasc  10 every day, hct 12.5 qd

## 2024-07-15 NOTE — Patient Instructions (Signed)
 Please take all new medication as prescribed  - the flexeril and mobic  as neeeded, and prednisone   Please call for your Physical Therapy as you have planned  Please continue all other medications as before, and refills have been done if requested.  Please have the pharmacy call with any other refills you may need.  Please keep your appointments with your specialists as you may have planned  You will be contacted regarding the referral for: Allergy

## 2024-08-04 ENCOUNTER — Other Ambulatory Visit: Payer: Self-pay | Admitting: Internal Medicine

## 2024-08-04 NOTE — Telephone Encounter (Unsigned)
 Copied from CRM #[id number]. Topic: Clinical - Medication Refill >> Aug 04, 2024  1:07 PM Franky GRADE wrote: Medication: zolpidem  (AMBIEN ) 10 MG tablet [[id number]]  Has the patient contacted their pharmacy? Yes, the pharmacy submitted multiple request without a response.  (Agent: If no, request that the patient contact the pharmacy for the refill. If patient does not wish to contact the pharmacy document the reason why and proceed with request.) (Agent: If yes, when and what did the pharmacy advise?)  This is the patient's preferred pharmacy:  [id number] [place], [place] - [address] AT [facility] OF [facility] & [address] [place] [address] Phone: [phone] Fax: [phone]  Is this the correct pharmacy for this prescription? Yes If no, delete pharmacy and type the correct one.   Has the prescription been filled recently? No  Is the patient out of the medication? Yes  Has the patient been seen for an appointment in the last year OR does the patient have an upcoming appointment? Yes  Can we respond through MyChart? Yes  Agent: Please be advised that Rx refills may take up to 3 business days. We ask that you follow-up with your pharmacy.

## 2024-08-05 ENCOUNTER — Telehealth: Payer: Self-pay

## 2024-08-05 MED ORDER — ZOLPIDEM TARTRATE 10 MG PO TABS: ORAL_TABLET | ORAL | 1 refills | Status: AC

## 2024-08-05 NOTE — Telephone Encounter (Signed)
 Copied from CRM #[id number]. Topic: Clinical - Prescription Issue >> Aug 05, 2024 10:44 AM Robinson H wrote: Reason for CRM: Patient calling to find out why refill for her zolpidem  (AMBIEN ) 10 MG tablet was denied, system shows Request refused: Refill not appropriate. Patient is out of medication.Patient at work okay to send [employer].  Ellen Fernandez [phone]

## 2024-08-05 NOTE — Telephone Encounter (Signed)
 Hello - not sure why the confusion, but I did this refill,   Thanks

## 2025-04-23 ENCOUNTER — Encounter: Admitting: Internal Medicine
